# Patient Record
Sex: Male | Born: 1950 | Race: White | Hispanic: No | Marital: Married | Smoking: Current every day smoker
Health system: Southern US, Community
[De-identification: ages and names within clinical notes are randomized; demographics above are authoritative.]

## PROBLEM LIST (undated history)

## (undated) DIAGNOSIS — I10 Essential (primary) hypertension: Secondary | ICD-10-CM

## (undated) DIAGNOSIS — E785 Hyperlipidemia, unspecified: Secondary | ICD-10-CM

## (undated) DIAGNOSIS — E119 Type 2 diabetes mellitus without complications: Secondary | ICD-10-CM

## (undated) DIAGNOSIS — I63232 Cerebral infarction due to unspecified occlusion or stenosis of left carotid arteries: Secondary | ICD-10-CM

## (undated) DIAGNOSIS — F419 Anxiety disorder, unspecified: Secondary | ICD-10-CM

## (undated) HISTORY — DX: Type 2 diabetes mellitus without complications: E11.9

## (undated) HISTORY — DX: Essential (primary) hypertension: I10

## (undated) HISTORY — DX: Hyperlipidemia, unspecified: E78.5

## (undated) HISTORY — DX: Anxiety disorder, unspecified: F41.9

## (undated) HISTORY — DX: Cerebral infarction due to unspecified occlusion or stenosis of left carotid arteries: I63.232

---

## 2008-05-09 ENCOUNTER — Encounter: Admission: RE | Admit: 2008-05-09 | Discharge: 2008-05-09 | Payer: Self-pay | Admitting: Internal Medicine

## 2008-06-04 ENCOUNTER — Encounter: Admission: RE | Admit: 2008-06-04 | Discharge: 2008-06-04 | Payer: Self-pay | Admitting: Internal Medicine

## 2009-09-05 ENCOUNTER — Inpatient Hospital Stay (HOSPITAL_COMMUNITY): Admission: EM | Admit: 2009-09-05 | Discharge: 2009-09-11 | Payer: Self-pay | Admitting: Emergency Medicine

## 2009-09-05 ENCOUNTER — Ambulatory Visit: Payer: Self-pay | Admitting: Internal Medicine

## 2009-09-08 ENCOUNTER — Encounter (INDEPENDENT_AMBULATORY_CARE_PROVIDER_SITE_OTHER): Payer: Self-pay | Admitting: Neurology

## 2009-09-08 ENCOUNTER — Ambulatory Visit: Payer: Self-pay | Admitting: Physical Medicine & Rehabilitation

## 2009-09-08 ENCOUNTER — Ambulatory Visit: Payer: Self-pay | Admitting: Vascular Surgery

## 2009-09-10 ENCOUNTER — Encounter (INDEPENDENT_AMBULATORY_CARE_PROVIDER_SITE_OTHER): Payer: Self-pay | Admitting: Neurology

## 2009-09-11 ENCOUNTER — Inpatient Hospital Stay (HOSPITAL_COMMUNITY)
Admission: RE | Admit: 2009-09-11 | Discharge: 2009-09-23 | Payer: Self-pay | Admitting: Physical Medicine & Rehabilitation

## 2009-10-21 ENCOUNTER — Encounter
Admission: RE | Admit: 2009-10-21 | Discharge: 2009-10-28 | Payer: Self-pay | Admitting: Physical Medicine & Rehabilitation

## 2009-10-28 ENCOUNTER — Ambulatory Visit: Payer: Self-pay | Admitting: Physical Medicine & Rehabilitation

## 2010-01-19 ENCOUNTER — Encounter
Admission: RE | Admit: 2010-01-19 | Discharge: 2010-03-18 | Payer: Self-pay | Admitting: Physical Medicine & Rehabilitation

## 2010-01-27 ENCOUNTER — Ambulatory Visit: Payer: Self-pay | Admitting: Physical Medicine & Rehabilitation

## 2010-03-18 ENCOUNTER — Encounter
Admission: RE | Admit: 2010-03-18 | Discharge: 2010-03-18 | Payer: Self-pay | Source: Home / Self Care | Attending: Physical Medicine & Rehabilitation | Admitting: Physical Medicine & Rehabilitation

## 2010-05-13 IMAGING — XA IR ANGIO/CAROTID/CERV BI
1 series · 12 of 24 positions shown · IV contrast (IODINE)
Comparison: MRI-MRA brain of 09/06/2009

CLINICAL DATA: Left cerebral hemispheric stroke with right-sided
weakness and aphasia.  Abnormal MRA of the neck and the brain.

BILATERAL CAROTID ARTERIOGRAPHY AND BILATERAL VERTEBRAL ARTERY
ANGIOGRAMS

[Series 300: neuro · 12 of 139 slices shown]
[im 7/139]
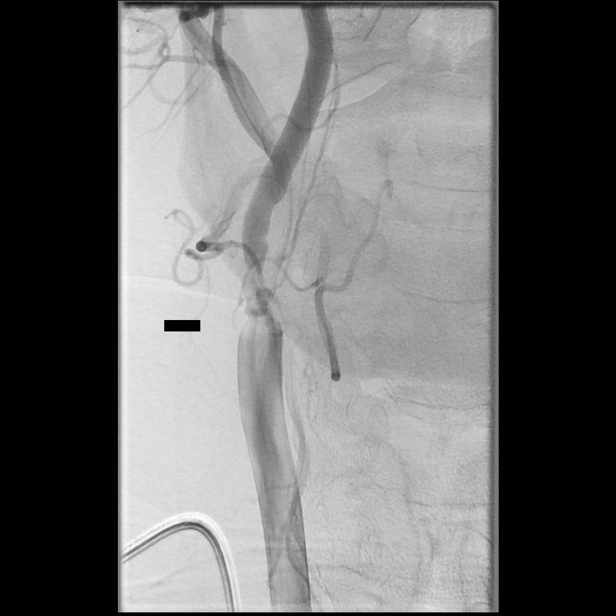
[im 19/139]
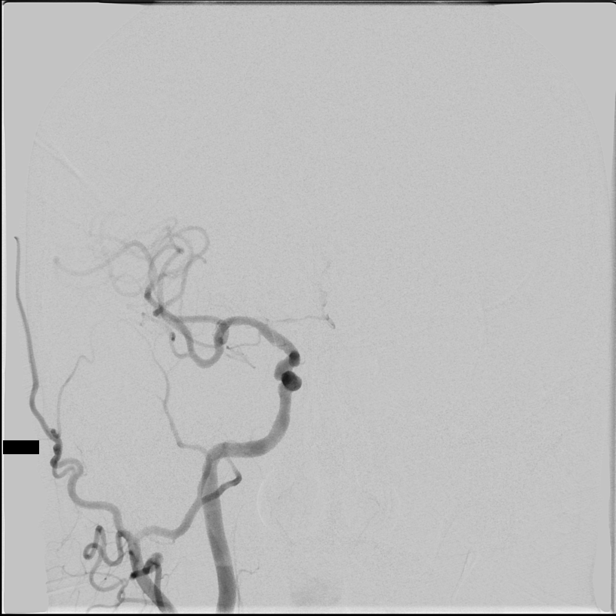
[im 31/139]
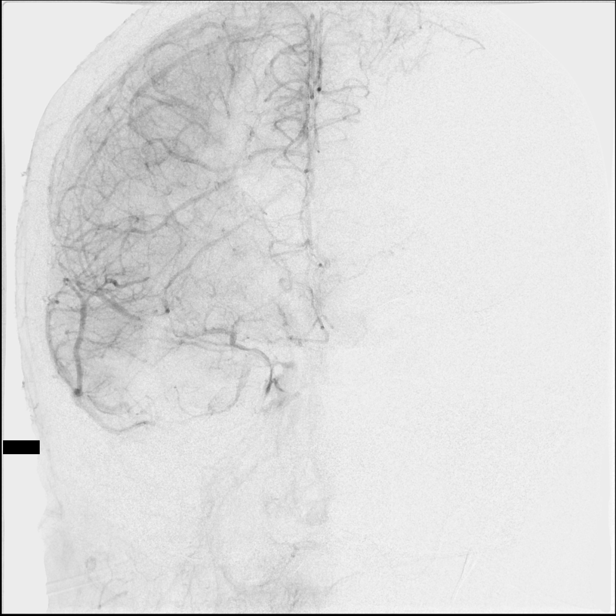
[im 43/139]
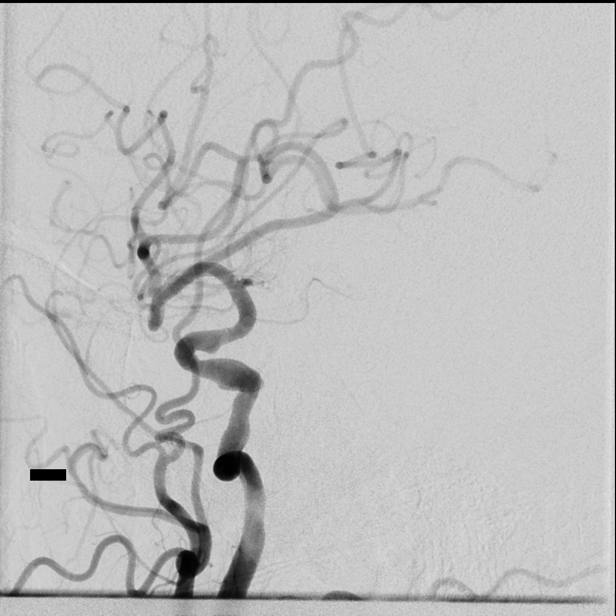
[im 55/139]
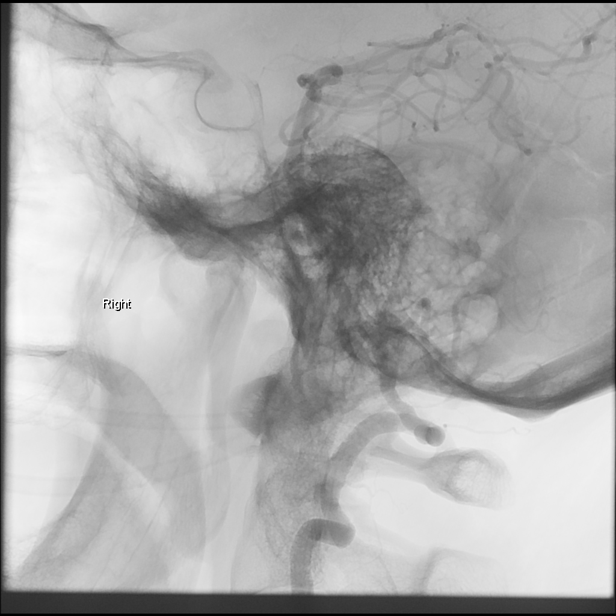
[im 67/139]
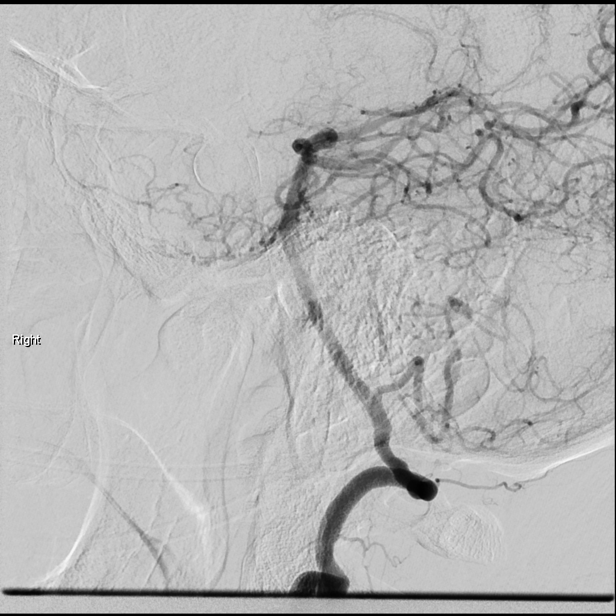
[im 79/139]
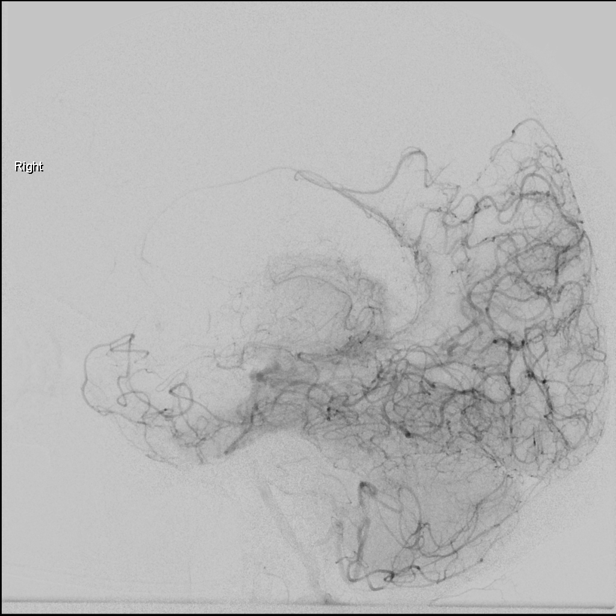
[im 91/139]
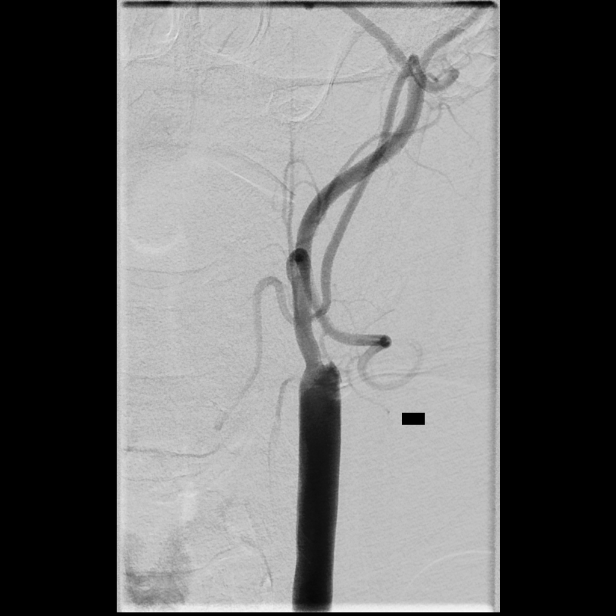
[im 103/139]
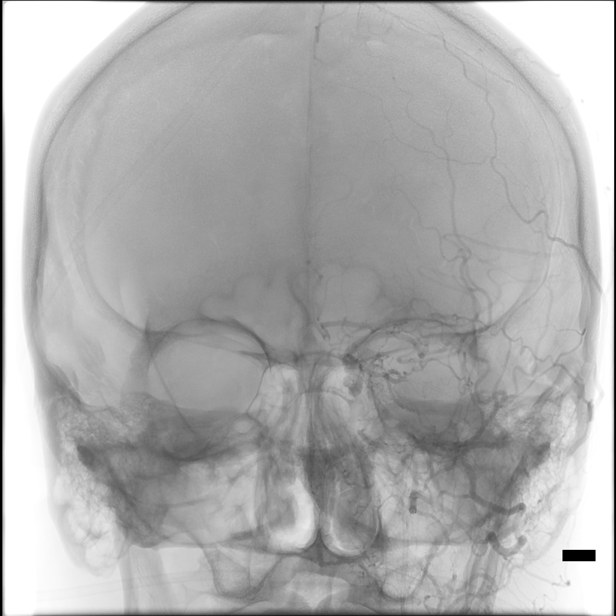
[im 115/139]
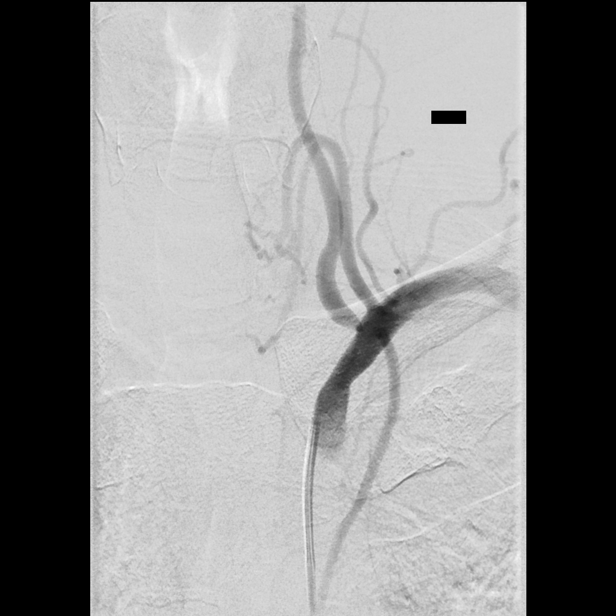
[im 127/139]
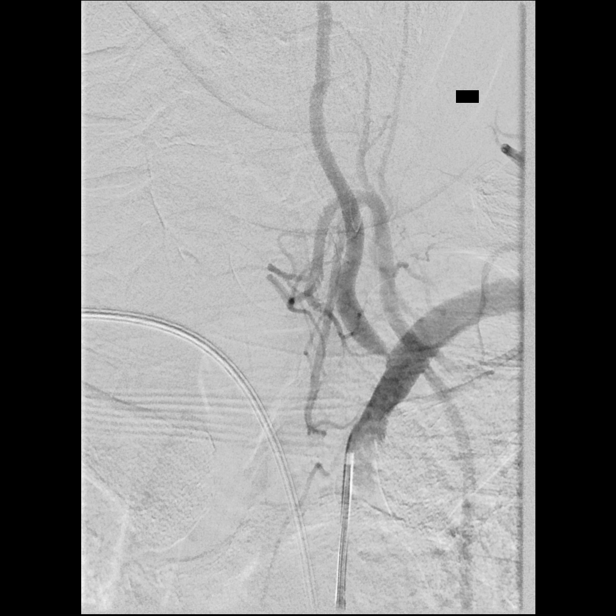
[im 139/139]
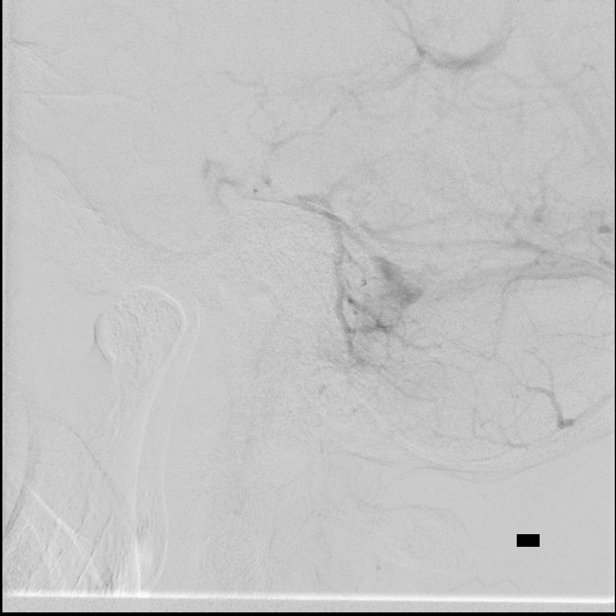

[12 of 24 positions shown; findings below may reference images not displayed]

Following a full explanation of the procedure along with the
potential associated complications, an informed witnessed consent
was obtained.

The right groin was prepped and draped in the usual sterile
fashion.  Thereafter using a modified Seldinger technique,
transfemoral access into the right common femoral artery was
obtained without difficulty.  Over a 0.035-inch guidewire, a 5-
French Pinnacle sheath was inserted.  Through this and also over a
0.035-inch guidewire, a 5-French JB1 catheter was advanced to the
aortic arch region and selectively positioned in the right common
carotid artery, the right vertebral artery, the left common carotid
artery and the left subclavian artery.

There were no acute complications.  The patient tolerated the
procedure well.

Medications utilized: Versed 1 mg IV.  Fentanyl 25 mg IV.

Contrast: Omnipaque 300 approximately 65 ml.
FINDINGS: The right common carotid arteriogram demonstrates the
right external carotid artery and its major branches to be normal.

The right internal carotid artery has an irregular plaque
associated with approximately 65-70% stenosis by the NASCET
criteria.  No intraluminal filling defects are seen.

The vessel is otherwise seen to opacify normally to the cranial
skull base.

The petrous segment is normal.

There is a focal approximately 40-50% narrowing of the right
internal carotid artery cavernous segment.

A mild broad-based protuberance is noted of the inferior aspect of
the distal cavernous segment of the right internal carotid artery.

The right middle cerebral artery is seen to opacify normally into
capillary and venous phases.  Moderate focal areas of
arteriosclerotic narrowing are seen involving the right anterior
cerebral artery A1 segment.  Opacification, however, is noted of
the right A2 segment.

The right vertebral artery origin is normal.  The vessel is seen to
opacify normally to the cranial skull base.

There is mild atherosclerotic irregularity involving the right
vertebrobasilar junction proximal to the right posterior inferior
cerebellar artery.

The right posterior inferior cerebral artery is seen to opacify
normally.

The basilar artery, the superior cerebellar arteries and the
anterior-inferior cerebellar arteries are seen to opacify normally
into capillary and venous phases.  There is a left anterior
inferior cerebral artery/posterior inferior cerebellar artery
complex.

There is mild narrowing of the left posterior cerebral artery
proximally.  Both posterior cerebral arteries are otherwise seen to
opacify normally into capillary and venous phases.  Leptomeningeal
collateralization of the parietal cortical branches of the left MCA
distribution is noted.

Also noted is retrograde opacification via the posterior temporal
branches of the anterior cerebral artery branches in the distal
corpus callosum.

The left common carotid arteriogram demonstrates the left external
carotid artery and its major branches to be normal.  The left
internal carotid artery has a stump at its origin.  There is no
string sign noted in the cervical segment of the left internal
carotid artery to the cranial skull base.

Distally, there is retrograde opacification of the distal cavernous
and supraclinoid segments from the ipsilateral ophthalmic artery
from the anterior ethmoidal branches.

Antegrade flow is noted in a diminutive left middle and left
anterior cerebral artery.  There is complete occlusion of the
superior division of the left middle cerebral artery.

The codominant left vertebral artery origin demonstrates a 90%
stenosis with mild post-stenotic dilatation.  The vessel is seen to
opacify normally to the cranial skull base.  Mild narrowing of the
left vertebrobasilar junction is noted just proximal to the
hypoplastic left posterior inferior cerebellar artery.

There is a left posterior-inferior cerebellar artery/anterior-
inferior cerebellar artery complex.  The opacified portion of the
basilar artery, the posterior cerebral arteries, the superior
cerebellar arteries and the anterior-inferior cerebellar arteries
is grossly normal into the delayed arterial phase.
IMPRESSION: 1.  65-70% stenosis of the right internal carotid artery at the
bulb by the NASCET criteria.
2.  90% stenosis of the origin of the left vertebral artery.

3.  40-50% stenosis of the right internal carotid artery in the
cavernous segment.
4.  Angiographically occluded left internal carotid artery at the
bulb without angiographic evidence of a string sign.  Partial
reconstitution of the left ICA in the cavernous segment from the
ipsilateral ophthalmic artery and the anterior ethmoidal arteries.
5.  Occluded superior division of the left middle cerebral artery.

## 2010-07-26 ENCOUNTER — Encounter: Payer: Self-pay | Admitting: Physical Medicine & Rehabilitation

## 2010-09-28 LAB — RAPID URINE DRUG SCREEN, HOSP PERFORMED
Amphetamines: NOT DETECTED
Barbiturates: NOT DETECTED
Cocaine: NOT DETECTED
Opiates: NOT DETECTED
Tetrahydrocannabinol: NOT DETECTED

## 2010-09-28 LAB — COMPREHENSIVE METABOLIC PANEL
Albumin: 3.2 g/dL — ABNORMAL LOW (ref 3.5–5.2)
Albumin: 3.2 g/dL — ABNORMAL LOW (ref 3.5–5.2)
Alkaline Phosphatase: 81 U/L (ref 39–117)
BUN: 7 mg/dL (ref 6–23)
BUN: 8 mg/dL (ref 6–23)
Calcium: 8.7 mg/dL (ref 8.4–10.5)
Chloride: 102 mEq/L (ref 96–112)
Creatinine, Ser: 0.66 mg/dL (ref 0.4–1.5)
GFR calc Af Amer: >60 mL/min (ref >60)
Potassium: 3.3 mEq/L — ABNORMAL LOW (ref 3.5–5.1)
Sodium: 137 mEq/L (ref 135–145)
Total Bilirubin: 0.6 mg/dL (ref 0.3–1.2)
Total Protein: 6.4 g/dL (ref 6.0–8.3)

## 2010-09-28 LAB — CBC
HCT: 37.2 % — ABNORMAL LOW (ref 39.0–52.0)
HCT: 38 % — ABNORMAL LOW (ref 39.0–52.0)
HCT: 39.9 % (ref 39.0–52.0)
HCT: 39.9 % (ref 39.0–52.0)
HCT: 45.8 % (ref 39.0–52.0)
Hemoglobin: 13 g/dL (ref 13.0–17.0)
Hemoglobin: 13.5 g/dL (ref 13.0–17.0)
Hemoglobin: 15.4 g/dL (ref 13.0–17.0)
MCHC: 33.8 g/dL (ref 30.0–36.0)
MCHC: 33.8 g/dL (ref 30.0–36.0)
MCHC: 34.8 g/dL (ref 30.0–36.0)
MCV: 93 fL (ref 78.0–100.0)
MCV: 93.4 fL (ref 78.0–100.0)
MCV: 93.5 fL (ref 78.0–100.0)
MCV: 93.7 fL (ref 78.0–100.0)
Platelets: 173 10*3/uL (ref 150–400)
Platelets: 228 10*3/uL (ref 150–400)
Platelets: 99 10*3/uL — ABNORMAL LOW (ref 150–400)
RBC: 4.19 MIL/uL — ABNORMAL LOW (ref 4.22–5.81)
RBC: 4.26 MIL/uL (ref 4.22–5.81)
RDW: 13.1 % (ref 11.5–15.5)
RDW: 13.2 % (ref 11.5–15.5)
RDW: 13.3 % (ref 11.5–15.5)
RDW: 13.4 % (ref 11.5–15.5)
RDW: 13.5 % (ref 11.5–15.5)
RDW: 13.5 % (ref 11.5–15.5)
WBC: 10.9 10*3/uL — ABNORMAL HIGH (ref 4.0–10.5)
WBC: 12 10*3/uL — ABNORMAL HIGH (ref 4.0–10.5)
WBC: 17.6 10*3/uL — ABNORMAL HIGH (ref 4.0–10.5)

## 2010-09-28 LAB — BASIC METABOLIC PANEL
BUN: 11 mg/dL (ref 6–23)
BUN: 16 mg/dL (ref 6–23)
CO2: 23 mEq/L (ref 19–32)
CO2: 27 mEq/L (ref 19–32)
CO2: 31 mEq/L (ref 19–32)
Calcium: 8.7 mg/dL (ref 8.4–10.5)
Calcium: 8.8 mg/dL (ref 8.4–10.5)
Calcium: 9.2 mg/dL (ref 8.4–10.5)
Calcium: 9.3 mg/dL (ref 8.4–10.5)
Chloride: 103 mEq/L (ref 96–112)
Chloride: 97 mEq/L (ref 96–112)
Creatinine, Ser: 0.75 mg/dL (ref 0.4–1.5)
Creatinine, Ser: 0.75 mg/dL (ref 0.4–1.5)
Creatinine, Ser: 0.86 mg/dL (ref 0.4–1.5)
GFR calc Af Amer: >60 mL/min (ref >60)
GFR calc Af Amer: >60 mL/min (ref >60)
Glucose, Bld: 116 mg/dL — ABNORMAL HIGH (ref 70–99)
Glucose, Bld: 120 mg/dL — ABNORMAL HIGH (ref 70–99)
Glucose, Bld: 144 mg/dL — ABNORMAL HIGH (ref 70–99)
Glucose, Bld: 97 mg/dL (ref 70–99)
Potassium: 3.4 mEq/L — ABNORMAL LOW (ref 3.5–5.1)
Potassium: 3.6 mEq/L (ref 3.5–5.1)
Sodium: 135 mEq/L (ref 135–145)
Sodium: 139 mEq/L (ref 135–145)
Sodium: 140 mEq/L (ref 135–145)
Sodium: 142 mEq/L (ref 135–145)

## 2010-09-28 LAB — URINE MICROSCOPIC-ADD ON

## 2010-09-28 LAB — DIFFERENTIAL
Basophils Absolute: 0.1 10*3/uL (ref 0.0–0.1)
Basophils Relative: 1 % (ref 0–1)
Eosinophils Absolute: 0.1 10*3/uL (ref 0.0–0.7)
Eosinophils Absolute: 0.1 10*3/uL (ref 0.0–0.7)
Eosinophils Relative: 1 % (ref 0–5)
Eosinophils Relative: 1 % (ref 0–5)
Lymphocytes Relative: 15 % (ref 12–46)
Lymphocytes Relative: 27 % (ref 12–46)
Lymphocytes Relative: 31 % (ref 12–46)
Lymphs Abs: 2.7 10*3/uL (ref 0.7–4.0)
Lymphs Abs: 3.7 10*3/uL (ref 0.7–4.0)
Monocytes Absolute: 1.1 10*3/uL — ABNORMAL HIGH (ref 0.1–1.0)
Monocytes Absolute: 1.6 10*3/uL — ABNORMAL HIGH (ref 0.1–1.0)
Monocytes Relative: 13 % — ABNORMAL HIGH (ref 3–12)
Neutro Abs: 13.5 10*3/uL — ABNORMAL HIGH (ref 1.7–7.7)
Neutro Abs: 6.8 10*3/uL (ref 1.7–7.7)

## 2010-09-28 LAB — GLUCOSE, CAPILLARY
Glucose-Capillary: 104 mg/dL — ABNORMAL HIGH (ref 70–99)
Glucose-Capillary: 111 mg/dL — ABNORMAL HIGH (ref 70–99)
Glucose-Capillary: 114 mg/dL — ABNORMAL HIGH (ref 70–99)
Glucose-Capillary: 116 mg/dL — ABNORMAL HIGH (ref 70–99)
Glucose-Capillary: 117 mg/dL — ABNORMAL HIGH (ref 70–99)
Glucose-Capillary: 117 mg/dL — ABNORMAL HIGH (ref 70–99)
Glucose-Capillary: 118 mg/dL — ABNORMAL HIGH (ref 70–99)
Glucose-Capillary: 122 mg/dL — ABNORMAL HIGH (ref 70–99)
Glucose-Capillary: 124 mg/dL — ABNORMAL HIGH (ref 70–99)
Glucose-Capillary: 127 mg/dL — ABNORMAL HIGH (ref 70–99)
Glucose-Capillary: 127 mg/dL — ABNORMAL HIGH (ref 70–99)
Glucose-Capillary: 130 mg/dL — ABNORMAL HIGH (ref 70–99)
Glucose-Capillary: 135 mg/dL — ABNORMAL HIGH (ref 70–99)
Glucose-Capillary: 141 mg/dL — ABNORMAL HIGH (ref 70–99)
Glucose-Capillary: 141 mg/dL — ABNORMAL HIGH (ref 70–99)
Glucose-Capillary: 143 mg/dL — ABNORMAL HIGH (ref 70–99)
Glucose-Capillary: 145 mg/dL — ABNORMAL HIGH (ref 70–99)
Glucose-Capillary: 158 mg/dL — ABNORMAL HIGH (ref 70–99)
Glucose-Capillary: 160 mg/dL — ABNORMAL HIGH (ref 70–99)
Glucose-Capillary: 167 mg/dL — ABNORMAL HIGH (ref 70–99)
Glucose-Capillary: 168 mg/dL — ABNORMAL HIGH (ref 70–99)
Glucose-Capillary: 174 mg/dL — ABNORMAL HIGH (ref 70–99)
Glucose-Capillary: 196 mg/dL — ABNORMAL HIGH (ref 70–99)
Glucose-Capillary: 95 mg/dL (ref 70–99)

## 2010-09-28 LAB — LUPUS ANTICOAGULANT PANEL: Lupus Anticoagulant: NOT DETECTED

## 2010-09-28 LAB — URINE CULTURE
Colony Count: >=100000
Special Requests: NEGATIVE

## 2010-09-28 LAB — PROTEIN C ACTIVITY: Protein C Activity: 172 % — ABNORMAL HIGH (ref 75–133)

## 2010-09-28 LAB — URINALYSIS, ROUTINE W REFLEX MICROSCOPIC
Bilirubin Urine: NEGATIVE
Glucose, UA: NEGATIVE mg/dL
Ketones, ur: NEGATIVE mg/dL
Protein, ur: NEGATIVE mg/dL
Protein, ur: NEGATIVE mg/dL
Specific Gravity, Urine: 1.027 (ref 1.005–1.030)
pH: 5.5 (ref 5.0–8.0)
pH: 6 (ref 5.0–8.0)

## 2010-09-28 LAB — HEMOGLOBIN A1C
Hgb A1c MFr Bld: 7.7 % — ABNORMAL HIGH (ref 4.6–6.1)
Mean Plasma Glucose: 174 mg/dL

## 2010-09-28 LAB — CARDIOLIPIN ANTIBODIES, IGG, IGM, IGA
Anticardiolipin IgA: <3 APL U/mL — ABNORMAL LOW (ref <10)
Anticardiolipin IgG: 3 GPL U/mL — ABNORMAL LOW (ref <10)
Anticardiolipin IgM: <3 MPL U/mL — ABNORMAL LOW (ref <10)

## 2010-09-28 LAB — LIPID PANEL
LDL Cholesterol: 123 mg/dL — ABNORMAL HIGH (ref 0–99)
Triglycerides: 124 mg/dL (ref <150)

## 2010-09-28 LAB — HEPATITIS PANEL, ACUTE
HCV Ab: NEGATIVE
Hepatitis B Surface Ag: NEGATIVE

## 2010-09-28 LAB — ANTITHROMBIN III: AntiThromb III Func: 111 % (ref 76–126)

## 2010-09-28 LAB — SEDIMENTATION RATE: Sed Rate: 52 mm/hr — ABNORMAL HIGH (ref 0–16)

## 2010-09-28 LAB — CARDIAC PANEL(CRET KIN+CKTOT+MB+TROPI)
CK, MB: 2.9 ng/mL (ref 0.3–4.0)
Relative Index: 1.4 (ref 0.0–2.5)
Relative Index: 1.5 (ref 0.0–2.5)
Total CK: 206 U/L (ref 7–232)

## 2010-09-28 LAB — PROTEIN S ACTIVITY: Protein S Activity: 95 % (ref 69–129)

## 2010-09-28 LAB — TROPONIN I: Troponin I: 0.04 ng/mL (ref 0.00–0.06)

## 2010-09-28 LAB — BETA-2-GLYCOPROTEIN I ABS, IGG/M/A: Beta-2 Glyco I IgG: <3 U/mL (ref <=15)

## 2010-09-28 LAB — VITAMIN B12: Vitamin B-12: 983 pg/mL — ABNORMAL HIGH (ref 211–911)

## 2010-09-28 LAB — HOMOCYSTEINE
Homocysteine: 6.6 umol/L (ref 4.0–15.4)
Homocysteine: 8.8 umol/L (ref 4.0–15.4)

## 2010-09-28 LAB — GC/CHLAMYDIA PROBE AMP, URINE: GC Probe Amp, Urine: NEGATIVE

## 2010-09-28 LAB — CK TOTAL AND CKMB (NOT AT ARMC)
CK, MB: 4.9 ng/mL — ABNORMAL HIGH (ref 0.3–4.0)
Relative Index: 0.4 (ref 0.0–2.5)

## 2010-09-28 LAB — PROTEIN S, TOTAL: Protein S Ag, Total: 129 % (ref 70–140)

## 2010-09-28 LAB — RPR
RPR Ser Ql: NONREACTIVE
RPR Ser Ql: NONREACTIVE

## 2010-09-28 LAB — FACTOR 5 LEIDEN

## 2010-09-28 LAB — HIV ANTIBODY (ROUTINE TESTING W REFLEX): HIV: NONREACTIVE

## 2010-09-28 LAB — POCT CARDIAC MARKERS: Myoglobin, poc: 139 ng/mL (ref 12–200)

## 2011-07-06 HISTORY — PX: CAROTID STENT INSERTION: SHX5766

## 2015-09-03 DIAGNOSIS — Z79899 Other long term (current) drug therapy: Secondary | ICD-10-CM | POA: Diagnosis not present

## 2015-09-03 DIAGNOSIS — E1159 Type 2 diabetes mellitus with other circulatory complications: Secondary | ICD-10-CM | POA: Diagnosis not present

## 2015-09-03 DIAGNOSIS — I10 Essential (primary) hypertension: Secondary | ICD-10-CM | POA: Diagnosis not present

## 2015-09-03 DIAGNOSIS — E784 Other hyperlipidemia: Secondary | ICD-10-CM | POA: Diagnosis not present

## 2015-09-08 DIAGNOSIS — I69351 Hemiplegia and hemiparesis following cerebral infarction affecting right dominant side: Secondary | ICD-10-CM | POA: Diagnosis not present

## 2015-09-08 DIAGNOSIS — E1165 Type 2 diabetes mellitus with hyperglycemia: Secondary | ICD-10-CM | POA: Diagnosis not present

## 2015-09-08 DIAGNOSIS — I69320 Aphasia following cerebral infarction: Secondary | ICD-10-CM | POA: Diagnosis not present

## 2015-09-08 DIAGNOSIS — E1159 Type 2 diabetes mellitus with other circulatory complications: Secondary | ICD-10-CM | POA: Diagnosis not present

## 2015-10-14 DIAGNOSIS — E875 Hyperkalemia: Secondary | ICD-10-CM | POA: Diagnosis not present

## 2015-10-20 DIAGNOSIS — I69351 Hemiplegia and hemiparesis following cerebral infarction affecting right dominant side: Secondary | ICD-10-CM | POA: Diagnosis not present

## 2015-10-20 DIAGNOSIS — Z0001 Encounter for general adult medical examination with abnormal findings: Secondary | ICD-10-CM | POA: Diagnosis not present

## 2015-10-20 DIAGNOSIS — E1165 Type 2 diabetes mellitus with hyperglycemia: Secondary | ICD-10-CM | POA: Diagnosis not present

## 2015-10-20 DIAGNOSIS — E1159 Type 2 diabetes mellitus with other circulatory complications: Secondary | ICD-10-CM | POA: Diagnosis not present

## 2015-10-20 DIAGNOSIS — I69320 Aphasia following cerebral infarction: Secondary | ICD-10-CM | POA: Diagnosis not present

## 2016-01-21 DIAGNOSIS — I6521 Occlusion and stenosis of right carotid artery: Secondary | ICD-10-CM | POA: Diagnosis not present

## 2016-01-27 DIAGNOSIS — E1165 Type 2 diabetes mellitus with hyperglycemia: Secondary | ICD-10-CM | POA: Diagnosis not present

## 2016-01-27 DIAGNOSIS — I10 Essential (primary) hypertension: Secondary | ICD-10-CM | POA: Diagnosis not present

## 2016-02-03 DIAGNOSIS — E875 Hyperkalemia: Secondary | ICD-10-CM | POA: Diagnosis not present

## 2016-02-03 DIAGNOSIS — M1990 Unspecified osteoarthritis, unspecified site: Secondary | ICD-10-CM | POA: Diagnosis not present

## 2016-02-03 DIAGNOSIS — E1169 Type 2 diabetes mellitus with other specified complication: Secondary | ICD-10-CM | POA: Diagnosis not present

## 2016-02-03 DIAGNOSIS — I69320 Aphasia following cerebral infarction: Secondary | ICD-10-CM | POA: Diagnosis not present

## 2016-02-03 DIAGNOSIS — E1159 Type 2 diabetes mellitus with other circulatory complications: Secondary | ICD-10-CM | POA: Diagnosis not present

## 2016-02-03 DIAGNOSIS — B351 Tinea unguium: Secondary | ICD-10-CM | POA: Diagnosis not present

## 2016-02-03 DIAGNOSIS — I69351 Hemiplegia and hemiparesis following cerebral infarction affecting right dominant side: Secondary | ICD-10-CM | POA: Diagnosis not present

## 2016-02-03 DIAGNOSIS — F329 Major depressive disorder, single episode, unspecified: Secondary | ICD-10-CM | POA: Diagnosis not present

## 2016-02-03 DIAGNOSIS — I10 Essential (primary) hypertension: Secondary | ICD-10-CM | POA: Diagnosis not present

## 2016-02-03 DIAGNOSIS — E785 Hyperlipidemia, unspecified: Secondary | ICD-10-CM | POA: Diagnosis not present

## 2016-02-03 DIAGNOSIS — E1165 Type 2 diabetes mellitus with hyperglycemia: Secondary | ICD-10-CM | POA: Diagnosis not present

## 2016-06-22 DIAGNOSIS — E1165 Type 2 diabetes mellitus with hyperglycemia: Secondary | ICD-10-CM | POA: Diagnosis not present

## 2016-06-22 DIAGNOSIS — I10 Essential (primary) hypertension: Secondary | ICD-10-CM | POA: Diagnosis not present

## 2016-07-15 DIAGNOSIS — I69320 Aphasia following cerebral infarction: Secondary | ICD-10-CM | POA: Diagnosis not present

## 2016-07-15 DIAGNOSIS — E785 Hyperlipidemia, unspecified: Secondary | ICD-10-CM | POA: Diagnosis not present

## 2016-07-15 DIAGNOSIS — I69351 Hemiplegia and hemiparesis following cerebral infarction affecting right dominant side: Secondary | ICD-10-CM | POA: Diagnosis not present

## 2016-07-15 DIAGNOSIS — F329 Major depressive disorder, single episode, unspecified: Secondary | ICD-10-CM | POA: Diagnosis not present

## 2016-07-15 DIAGNOSIS — E1159 Type 2 diabetes mellitus with other circulatory complications: Secondary | ICD-10-CM | POA: Diagnosis not present

## 2016-07-15 DIAGNOSIS — B351 Tinea unguium: Secondary | ICD-10-CM | POA: Diagnosis not present

## 2016-07-15 DIAGNOSIS — E1169 Type 2 diabetes mellitus with other specified complication: Secondary | ICD-10-CM | POA: Diagnosis not present

## 2016-07-15 DIAGNOSIS — I10 Essential (primary) hypertension: Secondary | ICD-10-CM | POA: Diagnosis not present

## 2016-07-15 DIAGNOSIS — E875 Hyperkalemia: Secondary | ICD-10-CM | POA: Diagnosis not present

## 2016-07-15 DIAGNOSIS — E1165 Type 2 diabetes mellitus with hyperglycemia: Secondary | ICD-10-CM | POA: Diagnosis not present

## 2016-07-15 DIAGNOSIS — M1990 Unspecified osteoarthritis, unspecified site: Secondary | ICD-10-CM | POA: Diagnosis not present

## 2017-01-14 DIAGNOSIS — I63232 Cerebral infarction due to unspecified occlusion or stenosis of left carotid arteries: Secondary | ICD-10-CM | POA: Diagnosis not present

## 2017-01-14 DIAGNOSIS — E1165 Type 2 diabetes mellitus with hyperglycemia: Secondary | ICD-10-CM | POA: Diagnosis not present

## 2017-01-14 DIAGNOSIS — I10 Essential (primary) hypertension: Secondary | ICD-10-CM | POA: Diagnosis not present

## 2017-01-14 DIAGNOSIS — Z6824 Body mass index (BMI) 24.0-24.9, adult: Secondary | ICD-10-CM | POA: Diagnosis not present

## 2017-01-14 DIAGNOSIS — F419 Anxiety disorder, unspecified: Secondary | ICD-10-CM | POA: Diagnosis not present

## 2017-01-14 DIAGNOSIS — E782 Mixed hyperlipidemia: Secondary | ICD-10-CM | POA: Diagnosis not present

## 2017-01-19 ENCOUNTER — Other Ambulatory Visit: Payer: Self-pay

## 2017-01-19 DIAGNOSIS — I63239 Cerebral infarction due to unspecified occlusion or stenosis of unspecified carotid arteries: Secondary | ICD-10-CM

## 2017-01-31 ENCOUNTER — Encounter: Payer: Self-pay | Admitting: Vascular Surgery

## 2017-03-14 ENCOUNTER — Encounter: Payer: Self-pay | Admitting: Surgery

## 2017-03-14 ENCOUNTER — Encounter (HOSPITAL_COMMUNITY): Payer: Self-pay

## 2017-03-18 ENCOUNTER — Encounter: Payer: Medicare Other | Admitting: Vascular Surgery

## 2017-03-18 ENCOUNTER — Encounter (HOSPITAL_COMMUNITY): Payer: Self-pay

## 2017-04-01 DIAGNOSIS — F419 Anxiety disorder, unspecified: Secondary | ICD-10-CM | POA: Diagnosis not present

## 2017-04-01 DIAGNOSIS — Z6824 Body mass index (BMI) 24.0-24.9, adult: Secondary | ICD-10-CM | POA: Diagnosis not present

## 2017-04-01 DIAGNOSIS — I63232 Cerebral infarction due to unspecified occlusion or stenosis of left carotid arteries: Secondary | ICD-10-CM | POA: Diagnosis not present

## 2017-04-01 DIAGNOSIS — I10 Essential (primary) hypertension: Secondary | ICD-10-CM | POA: Diagnosis not present

## 2017-04-01 DIAGNOSIS — E782 Mixed hyperlipidemia: Secondary | ICD-10-CM | POA: Diagnosis not present

## 2017-04-01 DIAGNOSIS — E1165 Type 2 diabetes mellitus with hyperglycemia: Secondary | ICD-10-CM | POA: Diagnosis not present

## 2017-04-22 ENCOUNTER — Ambulatory Visit (INDEPENDENT_AMBULATORY_CARE_PROVIDER_SITE_OTHER): Payer: Medicare Other | Admitting: Vascular Surgery

## 2017-04-22 ENCOUNTER — Ambulatory Visit (HOSPITAL_COMMUNITY)
Admission: RE | Admit: 2017-04-22 | Discharge: 2017-04-22 | Disposition: A | Discharge status: Home / Self Care | Payer: Medicare Other | Source: Ambulatory Visit | Attending: Vascular Surgery | Admitting: Vascular Surgery

## 2017-04-22 ENCOUNTER — Encounter: Payer: Self-pay | Admitting: Vascular Surgery

## 2017-04-22 VITALS — BP 123/76 | HR 68 | Temp 97.8°F | Resp 18 | Ht 68.0 in | Wt 160.1 lb

## 2017-04-22 DIAGNOSIS — I63232 Cerebral infarction due to unspecified occlusion or stenosis of left carotid arteries: Secondary | ICD-10-CM | POA: Diagnosis not present

## 2017-04-22 DIAGNOSIS — I63239 Cerebral infarction due to unspecified occlusion or stenosis of unspecified carotid arteries: Secondary | ICD-10-CM | POA: Insufficient documentation

## 2017-04-22 LAB — VAS US CAROTID
LCCAPDIAS: 20 cm/s
LEFT ECA DIAS: -50 cm/s
Left CCA prox sys: 98 cm/s
RIGHT CCA MID DIAS: 24 cm/s
RIGHT ECA DIAS: -43 cm/s
Right CCA prox dias: 21 cm/s
Right CCA prox sys: 77 cm/s
Right cca dist sys: -92 cm/s

## 2017-04-22 NOTE — Progress Notes (Signed)
Patient ID: Darren Chung, male   DOB: 03-Feb-1951, 66 y.o.   MRN: [id number]  Reason for Consult: new evaluation (hx of CVA with carotid stent) and Carotid   Referred by Richardean Chimera, MD  Subjective:     HPI:  Darren Chung is a 66 y.o. male with a history of diabetes hypertension high cholesterol and current every day smoker. He has a history of a left-sided stroke with occlusion of his left ICA. He also has had intervention on his right internal carotid artery he states was a stent in 2013. He also has a scar on the right neck suggestive of a right carotid endarterectomy in the past. Stent was placed 2013. He does not have symptoms from the stent. He is maintained on Plavix and statin. He does have bruising from a statin. He does not have any heart disease. He has no limitations to his walking. 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  Past Medical History:  Diagnosis Date  . Anxiety disorder   . Cerebral infarction due to unspecified occlusion or stenosis of left carotid arteries (HCC)   . Diabetes mellitus without complication (HCC)   . Hyperlipidemia   . Hypertension    Family History  Problem Relation Age of Onset  . Stroke Maternal Grandmother    Past Surgical History:  Procedure Laterality Date  . CAROTID STENT INSERTION Right 2013    Short Social History:  Social History  Substance Use Topics  . Smoking status: Current Every Day Smoker    Types: Cigarettes  . Smokeless tobacco: Current User     Comment: 35-40 years  . Alcohol use No    No Known Allergies  Current Outpatient Prescriptions  Medication Sig Dispense Refill  . amLODipine (NORVASC) 10 MG tablet Take 10 mg by mouth daily.    Marland Kitchen buPROPion (WELLBUTRIN XL) 150 MG 24 hr tablet Take 150 mg by mouth daily.    . clopidogrel (PLAVIX) 75 MG tablet Take 75 mg by mouth daily.    Marland Kitchen lisinopril (PRINIVIL,ZESTRIL) 5 MG tablet Take 5 mg by mouth daily.    .  metFORMIN (GLUCOPHAGE) 500 MG tablet Take 500 mg by mouth 2 (two) times daily with a meal.    . metoprolol tartrate (LOPRESSOR) 25 MG tablet Take 25 mg by mouth 2 (two) times daily.    . simvastatin (ZOCOR) 20 MG tablet Take 20 mg by mouth daily.     No current facility-administered medications for this visit.     Review of Systems  Constitutional:  Constitutional negative. HENT: HENT negative.  Eyes: Eyes negative.  Respiratory: Respiratory negative.  Cardiovascular: Positive for leg swelling.  GI: Gastrointestinal negative.  Musculoskeletal: Musculoskeletal negative.  Skin: Skin negative.  Neurological: Positive for focal weakness.  Hematologic: Positive for bruises/bleeds easily.        Objective:  Objective   Vitals:   04/22/17 1502 04/22/17 1503 04/22/17 1504  BP: 128/69 (!) 151/88 123/76  Pulse: 68    Resp: 18    Temp: 97.8 F (36.6 C)    TempSrc: Oral    SpO2: 97%    Weight: 160 lb 1.6 oz (72.6 kg)    Height: 5\' 8"  (1.727 m)     Body mass index is 24.34 kg/m.  Physical Exam  Constitutional: He is oriented to person, place, and time. He appears well-developed.  HENT:  Head: Normocephalic.  Eyes: Pupils are equal, round, and reactive to light.  Neck: Normal range of motion.  Well healed right neck incision  Cardiovascular: Normal rate.   Pulses:      Radial pulses are 2+ on the right side, and 2+ on the left side.       Femoral pulses are 2+ on the right side, and 2+ on the left side. Pulmonary/Chest: Effort normal.  Abdominal: Soft. He exhibits no mass.  Musculoskeletal: Normal range of motion. He exhibits no edema.  Neurological: He is alert and oriented to person, place, and time.  Skin: Skin is warm and dry.  Psychiatric: He has a normal mood and affect. His behavior is normal. Thought content normal.    Data: I have independently interpreted his carotid duplex which demonstrated an occluded left ICA with a stent in his right ICA velocities  demonstrate no significant restenosis.     Assessment/Plan:     66 year old male with a history of a left ICA occlusion and stroke and had presumably previous carotid endarterectomy and subsequent stenting but he is asymptomatic from that side. 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 He will continue Plavix at this time although I have offered that he could potentially switched to aspirin he is uncomfortable with this. We'll get him f/u with repeat duplex in 1 year. We discussed the signs and symptoms of stroke and he demonstrates good understanding.     Maeola HarmanBrandon Christopher Rayna Brenner MD [facility]

## 2017-05-17 NOTE — Addendum Note (Signed)
Addended by: Sherina Stammer A on: 05/17/2017 03:37 PM   Modules accepted: Orders  

## 2017-10-24 DIAGNOSIS — E782 Mixed hyperlipidemia: Secondary | ICD-10-CM | POA: Diagnosis not present

## 2017-10-24 DIAGNOSIS — Z1331 Encounter for screening for depression: Secondary | ICD-10-CM | POA: Diagnosis not present

## 2017-10-24 DIAGNOSIS — E1165 Type 2 diabetes mellitus with hyperglycemia: Secondary | ICD-10-CM | POA: Diagnosis not present

## 2017-10-24 DIAGNOSIS — Z1389 Encounter for screening for other disorder: Secondary | ICD-10-CM | POA: Diagnosis not present

## 2017-10-24 DIAGNOSIS — I10 Essential (primary) hypertension: Secondary | ICD-10-CM | POA: Diagnosis not present

## 2017-10-24 DIAGNOSIS — I63232 Cerebral infarction due to unspecified occlusion or stenosis of left carotid arteries: Secondary | ICD-10-CM | POA: Diagnosis not present

## 2017-10-24 DIAGNOSIS — Z6824 Body mass index (BMI) 24.0-24.9, adult: Secondary | ICD-10-CM | POA: Diagnosis not present

## 2017-10-24 DIAGNOSIS — F419 Anxiety disorder, unspecified: Secondary | ICD-10-CM | POA: Diagnosis not present

## 2018-04-21 DIAGNOSIS — F419 Anxiety disorder, unspecified: Secondary | ICD-10-CM | POA: Diagnosis not present

## 2018-04-21 DIAGNOSIS — I10 Essential (primary) hypertension: Secondary | ICD-10-CM | POA: Diagnosis not present

## 2018-04-21 DIAGNOSIS — E1165 Type 2 diabetes mellitus with hyperglycemia: Secondary | ICD-10-CM | POA: Diagnosis not present

## 2018-04-21 DIAGNOSIS — E782 Mixed hyperlipidemia: Secondary | ICD-10-CM | POA: Diagnosis not present

## 2018-04-24 DIAGNOSIS — I10 Essential (primary) hypertension: Secondary | ICD-10-CM | POA: Diagnosis not present

## 2018-04-24 DIAGNOSIS — Z0000 Encounter for general adult medical examination without abnormal findings: Secondary | ICD-10-CM | POA: Diagnosis not present

## 2018-04-24 DIAGNOSIS — Z6825 Body mass index (BMI) 25.0-25.9, adult: Secondary | ICD-10-CM | POA: Diagnosis not present

## 2018-09-01 ENCOUNTER — Other Ambulatory Visit: Payer: Self-pay

## 2018-09-01 DIAGNOSIS — I63232 Cerebral infarction due to unspecified occlusion or stenosis of left carotid arteries: Secondary | ICD-10-CM

## 2018-09-01 DIAGNOSIS — I63239 Cerebral infarction due to unspecified occlusion or stenosis of unspecified carotid arteries: Secondary | ICD-10-CM

## 2018-09-08 ENCOUNTER — Encounter (HOSPITAL_COMMUNITY): Payer: Medicare Other

## 2018-09-08 ENCOUNTER — Ambulatory Visit: Payer: Medicare Other | Admitting: Family

## 2018-10-06 ENCOUNTER — Ambulatory Visit: Payer: Medicare Other | Admitting: Family

## 2018-10-06 ENCOUNTER — Encounter (HOSPITAL_COMMUNITY): Payer: Medicare Other

## 2018-10-23 DIAGNOSIS — F419 Anxiety disorder, unspecified: Secondary | ICD-10-CM | POA: Diagnosis not present

## 2018-10-23 DIAGNOSIS — E1165 Type 2 diabetes mellitus with hyperglycemia: Secondary | ICD-10-CM | POA: Diagnosis not present

## 2018-10-23 DIAGNOSIS — E782 Mixed hyperlipidemia: Secondary | ICD-10-CM | POA: Diagnosis not present

## 2018-10-23 DIAGNOSIS — I10 Essential (primary) hypertension: Secondary | ICD-10-CM | POA: Diagnosis not present

## 2018-10-26 DIAGNOSIS — E782 Mixed hyperlipidemia: Secondary | ICD-10-CM | POA: Diagnosis not present

## 2018-10-26 DIAGNOSIS — E1165 Type 2 diabetes mellitus with hyperglycemia: Secondary | ICD-10-CM | POA: Diagnosis not present

## 2018-10-26 DIAGNOSIS — I10 Essential (primary) hypertension: Secondary | ICD-10-CM | POA: Diagnosis not present

## 2018-10-26 DIAGNOSIS — F419 Anxiety disorder, unspecified: Secondary | ICD-10-CM | POA: Diagnosis not present

## 2018-10-26 DIAGNOSIS — I63232 Cerebral infarction due to unspecified occlusion or stenosis of left carotid arteries: Secondary | ICD-10-CM | POA: Diagnosis not present

## 2018-10-26 DIAGNOSIS — Z6824 Body mass index (BMI) 24.0-24.9, adult: Secondary | ICD-10-CM | POA: Diagnosis not present

## 2018-10-30 ENCOUNTER — Encounter (INDEPENDENT_AMBULATORY_CARE_PROVIDER_SITE_OTHER): Payer: Self-pay

## 2019-01-15 ENCOUNTER — Other Ambulatory Visit: Payer: Self-pay

## 2019-01-15 ENCOUNTER — Ambulatory Visit (INDEPENDENT_AMBULATORY_CARE_PROVIDER_SITE_OTHER): Payer: Medicare Other | Admitting: Family

## 2019-01-15 ENCOUNTER — Ambulatory Visit (HOSPITAL_COMMUNITY)
Admission: RE | Admit: 2019-01-15 | Discharge: 2019-01-15 | Disposition: A | Discharge status: Home / Self Care | Payer: Medicare Other | Source: Ambulatory Visit | Attending: Vascular Surgery | Admitting: Vascular Surgery

## 2019-01-15 ENCOUNTER — Encounter: Payer: Self-pay | Admitting: Family

## 2019-01-15 VITALS — BP 125/77 | HR 66 | Temp 97.6°F | Resp 12 | Ht 68.0 in | Wt 157.3 lb

## 2019-01-15 DIAGNOSIS — Z9889 Other specified postprocedural states: Secondary | ICD-10-CM | POA: Diagnosis not present

## 2019-01-15 DIAGNOSIS — I63239 Cerebral infarction due to unspecified occlusion or stenosis of unspecified carotid arteries: Secondary | ICD-10-CM

## 2019-01-15 DIAGNOSIS — I63232 Cerebral infarction due to unspecified occlusion or stenosis of left carotid arteries: Secondary | ICD-10-CM | POA: Insufficient documentation

## 2019-01-15 DIAGNOSIS — F17200 Nicotine dependence, unspecified, uncomplicated: Secondary | ICD-10-CM | POA: Diagnosis not present

## 2019-01-15 NOTE — Progress Notes (Signed)
Chief Complaint: Follow up Extracranial Carotid Artery Stenosis   History of Present Illness  Darren Chung is a 68 y.o. male whom Dr. Randie Heinzain evaluated on 04-22-17 for extracranial carotid artery stenosis.  He has a history of diabetes, hypertension, dyslipidemia, and current every day smoker.  He has a history of a left brain stroke with occlusion of his left ICA.  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  He also has had intervention on his right internal carotid artery, he states was a stent in 2013.  He also has a scar on the right neck suggestive of a right carotid endarterectomy in the past. Stent was placed 2013.  He does not have symptoms from the stent.  He is maintained on Plavix and statin.  He does have bruising from a statin.  He does not have any heart disease.  He has no limitations to his walking.     Dr. Randie Heinzain last evaluated pt on 04-22-17. At that time pt had a history of a left ICA occlusion and stroke and had presumably previous carotid endarterectomy and subsequent stenting but he was asymptomatic from that side. 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 He was to continue Plavix at that time although Dr. Randie Heinzain offered that he could potentially switch to aspirin he was uncomfortable with that. He was to return with repeat duplex in 1 year.  Dtr (on the phone) states that his right foot is dropped since the stroke, but he states he has no trouble walking.  Pt denies pain or weakness in his legs with walking, denies non healing wounds in his feet or legs.   Diabetic: yes, daughter states last A1C was 5.1, normal Tobacco use: smoker  (8 cigs/day, dtr states he quit for 3-4 years after his stroke then resumed)  Pt meds include: Statin : yes  Beta blocker: yes ASA: no Other anticoagulants/antiplatelets: Plavix   Past Medical History:  Diagnosis Date  . Anxiety disorder   .  Cerebral infarction due to unspecified occlusion or stenosis of left carotid arteries (HCC)   . Diabetes mellitus without complication (HCC)   . Hyperlipidemia   . Hypertension     Social History Social History   Tobacco Use  . Smoking status: Current Every Day Smoker    Types: Cigarettes  . Smokeless tobacco: Current User  . Tobacco comment: 35-40 years  Substance Use Topics  . Alcohol use: No  . Drug use: Not on file    Family History Family History  Problem Relation Age of Onset  . Stroke Maternal Grandmother     Surgical History Past Surgical History:  Procedure Laterality Date  . CAROTID STENT INSERTION Right 2013    No Known Allergies  Current Outpatient Medications  Medication Sig Dispense Refill  . amLODipine (NORVASC) 10 MG tablet Take 10 mg by mouth daily.    Marland Kitchen. buPROPion (WELLBUTRIN XL) 150 MG 24 hr tablet Take 150 mg by mouth daily.    . clopidogrel (PLAVIX) 75 MG tablet Take 75 mg by mouth daily.    Marland Kitchen. lisinopril (PRINIVIL,ZESTRIL) 5 MG tablet Take 5 mg by mouth daily.    . metFORMIN (GLUCOPHAGE) 500 MG tablet Take 500 mg by mouth 2 (two) times daily with a meal.    . metoprolol tartrate (LOPRESSOR) 25 MG tablet Take 25 mg by mouth 2 (two) times daily.    . simvastatin (ZOCOR) 20 MG tablet  Take 20 mg by mouth daily.     No current facility-administered medications for this visit.     Review of Systems : See HPI for pertinent positives and negatives.  Physical Examination  Vitals:   01/15/19 1240  BP: 125/77  Pulse: 66  Resp: 12  Temp: 97.6 F (36.4 C)  TempSrc: Temporal  SpO2: 98%  Weight: 157 lb 4.8 oz (71.4 kg)  Height: 5\' 8"  (1.727 m)   Body mass index is 23.92 kg/m.  General: WDWN pleasant male in NAD GAIT: normal Eyes: PERRLA HENT: No gross abnormalities.  Pulmonary:  Respirations are non-labored, fair air movement in all fields, no rales,  rhonchi, or wheezing. Cardiac: regular rhythm, no detected murmur.  VASCULAR EXAM Carotid  Bruits Right Left   Negative Negative     Abdominal aortic pulse is not palpable. Radial pulses are 2+ palpable and equal.                                                                                                                            LE Pulses Right Left       POPLITEAL  not palpable   not palpable       POSTERIOR TIBIAL  not palpable   not palpable        DORSALIS PEDIS      ANTERIOR TIBIAL not palpable  not palpable    Gastrointestinal: soft, nontender, BS WNL, no r/g, no palpable masses. Musculoskeletal: + muscle atrophy/wasting in right upper extremity. M/S 5/5 throughout except 0/5 in right UE, extremities without ischemic changes. Skin: No rashes, no ulcers, no cellulitis.   Neurologic:  A&O X 3; appropriate affect, sensation is normal; speech is slightly aphasic, CN 2-12 intact, pain and light touch intact in extremities, motor exam as listed above. Psychiatric: Normal thought content, mood appropriate to clinical situation.    Assessment: Darren FudgeForest Matthies is a 68 y.o. male who has a history of a left ICA occlusion and left brain stroke and had presumably previous carotid endarterectomy and subsequent stenting in 2013 but he is asymptomatic from that side.  His right arm is flaccid since the stroke, he has mild right foot drop. Daughter Debbe Chung (on the phone), states pt has had no subsequent strokes or TIA's.   Today's carotid duplex shows a patent right internal carotid artery stent with no evidence for restenosis. Left Carotid: Known total occlusion of the left ICA. No significant change compared to the exam in October 2018.  His atherosclerotic risk factors include active smoking, history of DM with current normal A1C,, and history of stroke. He takes a daily statin and Plavix. He has no bleeding issues. Continue Plavix and a statin.    DATA Carotid Duplex (01-15-19): Right Carotid Findings: +----------+--------+--------+--------+--------+--------+            PSV cm/sEDV cm/sStenosisDescribeComments +----------+--------+--------+--------+--------+--------+ CCA Prox  86      16                               +----------+--------+--------+--------+--------+--------+  CCA Mid   92      22                               +----------+--------+--------+--------+--------+--------+ CCA Distal80      19                      stent    +----------+--------+--------+--------+--------+--------+ ICA Prox                                  stent    +----------+--------+--------+--------+--------+--------+ ICA Mid   69      19                               +----------+--------+--------+--------+--------+--------+ ICA Distal55      22                               +----------+--------+--------+--------+--------+--------+ ECA       264     26                               +----------+--------+--------+--------+--------+--------+  +----------+--------+-------+----------------+-------------------+           PSV cm/sEDV cmsDescribe        Arm Pressure (mmHG) +----------+--------+-------+----------------+-------------------+ [id number]     3      Multiphasic, WNL                    +----------+--------+-------+----------------+-------------------+  +---------+--------+--+--------+--+---------+ VertebralPSV cm/s57EDV cm/s16Antegrade +---------+--------+--+--------+--+---------+    Right Stent(s): +---------------+--+--++++ Proximal Stent 7316 +---------------+--+--++++ Mid Stent      6820 +---------------+--+--++++ Distal Stent   7924 +---------------+--+--++++ Distal to [id number] +---------------+--+--++++   Left Carotid Findings: +----------+--------+--------+--------+------------+--------+           PSV cm/sEDV cm/sStenosisDescribe    Comments +----------+--------+--------+--------+------------+--------+ CCA Prox  111     12                                    +----------+--------+--------+--------+------------+--------+ CCA Mid   50      11              heterogenous         +----------+--------+--------+--------+------------+--------+ CCA Distal69      15              heterogenous         +----------+--------+--------+--------+------------+--------+ ICA Prox                  Occluded                     +----------+--------+--------+--------+------------+--------+ ICA Mid                   Occluded                     +----------+--------+--------+--------+------------+--------+ ICA Distal                Occluded                     +----------+--------+--------+--------+------------+--------+ ECA       201  27                                   +----------+--------+--------+--------+------------+--------+  +----------+--------+--------+----------------+-------------------+ SubclavianPSV cm/sEDV cm/sDescribe        Arm Pressure (mmHG) +----------+--------+--------+----------------+-------------------+           179     3       Multiphasic, WNL                    +----------+--------+--------+----------------+-------------------+  +---------+--------+--+--------+--+---------+ VertebralPSV cm/s54EDV cm/s18Antegrade +---------+--------+--+--------+--+---------+ Summary: Right Carotid: The ECA appears >50% stenosed, limited visualization. Patent                right internal carotid artery stent with no evidence for                restenosis. Left Carotid: Known total occlusion of the left ICA. No significant change compared to the exam in October 2018.   Plan: Follow-up in 1 year with Carotid Duplex scan.  Over 3 minutes was spent counseling patient re smoking cessation, and patient was given some free resources re smoking cessation.     I discussed in depth with the patient the nature of atherosclerosis, and emphasized the importance of maximal  medical management including strict control of blood pressure, blood glucose, and lipid levels, obtaining regular exercise, and cessation of smoking.  The patient is aware that without maximal medical management the underlying atherosclerotic disease process will progress, limiting the benefit of any interventions. The patient was given information about stroke prevention and what symptoms should prompt the patient to seek immediate medical care. Thank you for allowing us to participate in this patient's care.  Charisse MarchSuzanne Nickel, RN, MSN, FNP-C [facility] Office: [phone]  Clinic Physician: Myra GianottiBrabham  01/15/19 12:44 PM

## 2019-01-15 NOTE — Patient Instructions (Signed)
Steps to Quit Smoking Smoking tobacco is the leading cause of preventable death. It can affect almost every organ in the body. Smoking puts you and people around you at risk for many serious, long-lasting (chronic) diseases. Quitting smoking can be hard, but it is one of the best things that you can do for your health. It is never too late to quit. How do I get ready to quit? When you decide to quit smoking, make a plan to help you succeed. Before you quit:  Pick a date to quit. Set a date within the next 2 weeks to give you time to prepare.  Write down the reasons why you are quitting. Keep this list in places where you will see it often.  Tell your family, friends, and co-workers that you are quitting. Their support is important.  Talk with your doctor about the choices that may help you quit.  Find out if your health insurance will pay for these treatments.  Know the people, places, things, and activities that make you want to smoke (triggers). Avoid them. What first steps can I take to quit smoking?  Throw away all cigarettes at home, at work, and in your car.  Throw away the things that you use when you smoke, such as ashtrays and lighters.  Clean your car. Make sure to empty the ashtray.  Clean your home, including curtains and carpets. What can I do to help me quit smoking? Talk with your doctor about taking medicines and seeing a counselor at the same time. You are more likely to succeed when you do both.  If you are pregnant or breastfeeding, talk with your doctor about counseling or other ways to quit smoking. Do not take medicine to help you quit smoking unless your doctor tells you to do so. To quit smoking: Quit right away  Quit smoking totally, instead of slowly cutting back on how much you smoke over a period of time.  Go to counseling. You are more likely to quit if you go to counseling sessions regularly. Take medicine You may take medicines to help you quit. Some  medicines need a prescription, and some you can buy over-the-counter. Some medicines may contain a drug called nicotine to replace the nicotine in cigarettes. Medicines may:  Help you to stop having the desire to smoke (cravings).  Help to stop the problems that come when you stop smoking (withdrawal symptoms). Your doctor may ask you to use:  Nicotine patches, gum, or lozenges.  Nicotine inhalers or sprays.  Non-nicotine medicine that is taken by mouth. Find resources Find resources and other ways to help you quit smoking and remain smoke-free after you quit. These resources are most helpful when you use them often. They include:  Online chats with a counselor.  Phone quitlines.  Printed self-help materials.  Support groups or group counseling.  Text messaging programs.  Mobile phone apps. Use apps on your mobile phone or tablet that can help you stick to your quit plan. There are many free apps for mobile phones and tablets as well as websites. Examples include Quit Guide from the CDC and smokefree.gov  What things can I do to make it easier to quit?   Talk to your family and friends. Ask them to support and encourage you.  Call a phone quitline (1-800-QUIT-NOW), reach out to support groups, or work with a counselor.  Ask people who smoke to not smoke around you.  Avoid places that make you want to smoke,   such as: ? Bars. ? Parties. ? Smoke-break areas at work.  Spend time with people who do not smoke.  Lower the stress in your life. Stress can make you want to smoke. Try these things to help your stress: ? Getting regular exercise. ? Doing deep-breathing exercises. ? Doing yoga. ? Meditating. ? Doing a body scan. To do this, close your eyes, focus on one area of your body at a time from head to toe. Notice which parts of your body are tense. Try to relax the muscles in those areas. How will I feel when I quit smoking? Day 1 to 3 weeks Within the first 24 hours,  you may start to have some problems that come from quitting tobacco. These problems are very bad 2-3 days after you quit, but they do not often last for more than 2-3 weeks. You may get these symptoms:  Mood swings.  Feeling restless, nervous, angry, or annoyed.  Trouble concentrating.  Dizziness.  Strong desire for high-sugar foods and nicotine.  Weight gain.  Trouble pooping (constipation).  Feeling like you may vomit (nausea).  Coughing or a sore throat.  Changes in how the medicines that you take for other issues work in your body.  Depression.  Trouble sleeping (insomnia). Week 3 and afterward After the first 2-3 weeks of quitting, you may start to notice more positive results, such as:  Better sense of smell and taste.  Less coughing and sore throat.  Slower heart rate.  Lower blood pressure.  Clearer skin.  Better breathing.  Fewer sick days. Quitting smoking can be hard. Do not give up if you fail the first time. Some people need to try a few times before they succeed. Do your best to stick to your quit plan, and talk with your doctor if you have any questions or concerns. Summary  Smoking tobacco is the leading cause of preventable death. Quitting smoking can be hard, but it is one of the best things that you can do for your health.  When you decide to quit smoking, make a plan to help you succeed.  Quit smoking right away, not slowly over a period of time.  When you start quitting, seek help from your doctor, family, or friends. This information is not intended to replace advice given to you by your health care provider. Make sure you discuss any questions you have with your health care provider. Document Released: 04/17/2009 Document Revised: 09/08/2018 Document Reviewed: 09/09/2018 Elsevier Patient Education  2020 Elsevier Inc.     Stroke Prevention Some medical conditions and lifestyle choices can lead to a higher risk for a stroke. You can  help to prevent a stroke by making nutrition, lifestyle, and other changes. What nutrition changes can be made?   Eat healthy foods. ? Choose foods that are high in fiber. These include:  Fresh fruits.  Fresh vegetables.  Whole grains. ? Eat at least 5 or more servings of fruits and vegetables each day. Try to fill half of your plate at each meal with fruits and vegetables. ? Choose lean protein foods. These include:  Lowfat (lean) cuts of meat.  Chicken without skin.  Fish.  Tofu.  Beans.  Nuts. ? Eat low-fat dairy products. ? Avoid foods that:  Are high in salt (sodium).  Have saturated fat.  Have trans fat.  Have cholesterol.  Are processed.  Are premade.  Follow eating guidelines as told by your doctor. These may include: ? Reducing how many calories you   eat and drink each day. ? Limiting how much salt you eat or drink each day to 1,500 milligrams (mg). ? Using only healthy fats for cooking. These include:  Olive oil.  Canola oil.  Sunflower oil. ? Counting how many carbohydrates you eat and drink each day. What lifestyle changes can be made?  Try to stay at a healthy weight. Talk to your doctor about what a good weight is for you.  Get at least 30 minutes of moderate physical activity at least 5 days a week. This can include: ? Fast walking. ? Biking. ? Swimming.  Do not use any products that have nicotine or tobacco. This includes cigarettes and e-cigarettes. If you need help quitting, ask your doctor. Avoid being around tobacco smoke in general.  Limit how much alcohol you drink to no more than 1 drink a day for nonpregnant women and 2 drinks a day for men. One drink equals 12 oz of beer, 5 oz of wine, or 1 oz of hard liquor.  Do not use drugs.  Avoid taking birth control pills. Talk to your doctor about the risks of taking birth control pills if: ? You are over 35 years old. ? You smoke. ? You get migraines. ? You have had a blood clot.  What other changes can be made?  Manage your cholesterol. ? It is important to eat a healthy diet. ? If your cholesterol cannot be managed through your diet, you may also need to take medicines. Take medicines as told by your doctor.  Manage your diabetes. ? It is important to eat a healthy diet and to exercise regularly. ? If your blood sugar cannot be managed through diet and exercise, you may need to take medicines. Take medicines as told by your doctor.  Control your high blood pressure (hypertension). ? Try to keep your blood pressure below 130/80. This can help lower your risk of stroke. ? It is important to eat a healthy diet and to exercise regularly. ? If your blood pressure cannot be managed through diet and exercise, you may need to take medicines. Take medicines as told by your doctor. ? Ask your doctor if you should check your blood pressure at home. ? Have your blood pressure checked every year. Do this even if your blood pressure is normal.  Talk to your doctor about getting checked for a sleep disorder. Signs of this can include: ? Snoring a lot. ? Feeling very tired.  Take over-the-counter and prescription medicines only as told by your doctor. These may include aspirin or blood thinners (antiplatelets or anticoagulants).  Make sure that any other medical conditions you have are managed. Where to find more information  American Stroke Association: www.strokeassociation.org  National Stroke Association: www.stroke.org Get help right away if:  You have any symptoms of stroke. "BE FAST" is an easy way to remember the main warning signs: ? B - Balance. Signs are dizziness, sudden trouble walking, or loss of balance. ? E - Eyes. Signs are trouble seeing or a sudden change in how you see. ? F - Face. Signs are sudden weakness or loss of feeling of the face, or the face or eyelid drooping on one side. ? A - Arms. Signs are weakness or loss of feeling in an arm. This  happens suddenly and usually on one side of the body. ? S - Speech. Signs are sudden trouble speaking, slurred speech, or trouble understanding what people say. ? T - Time. Time to call emergency   services. Write down what time symptoms started.  You have other signs of stroke, such as: ? A sudden, very bad headache with no known cause. ? Feeling sick to your stomach (nausea). ? Throwing up (vomiting). ? Jerky movements you cannot control (seizure). These symptoms may represent a serious problem that is an emergency. Do not wait to see if the symptoms will go away. Get medical help right away. Call your local emergency services (911 in the [place] Do not drive yourself to the hospital. Summary  You can prevent a stroke by eating healthy, exercising, not smoking, drinking less alcohol, and treating other health problems, such as diabetes, high blood pressure, or high cholesterol.  Do not use any products that contain nicotine or tobacco, such as cigarettes and e-cigarettes.  Get help right away if you have any signs or symptoms of a stroke. This information is not intended to replace advice given to you by your health care provider. Make sure you discuss any questions you have with your health care provider. Document Released: 12/21/2011 Document Revised: 08/17/2018 Document Reviewed: 09/22/2016 Elsevier Patient Education  2020 Elsevier Inc.  

## 2019-04-23 DIAGNOSIS — I10 Essential (primary) hypertension: Secondary | ICD-10-CM | POA: Diagnosis not present

## 2019-04-23 DIAGNOSIS — E1165 Type 2 diabetes mellitus with hyperglycemia: Secondary | ICD-10-CM | POA: Diagnosis not present

## 2019-04-23 DIAGNOSIS — E782 Mixed hyperlipidemia: Secondary | ICD-10-CM | POA: Diagnosis not present

## 2019-04-27 DIAGNOSIS — Z0000 Encounter for general adult medical examination without abnormal findings: Secondary | ICD-10-CM | POA: Diagnosis not present

## 2019-04-27 DIAGNOSIS — E1165 Type 2 diabetes mellitus with hyperglycemia: Secondary | ICD-10-CM | POA: Diagnosis not present

## 2019-04-27 DIAGNOSIS — I10 Essential (primary) hypertension: Secondary | ICD-10-CM | POA: Diagnosis not present

## 2019-04-27 DIAGNOSIS — I63232 Cerebral infarction due to unspecified occlusion or stenosis of left carotid arteries: Secondary | ICD-10-CM | POA: Diagnosis not present

## 2019-04-27 DIAGNOSIS — Z6823 Body mass index (BMI) 23.0-23.9, adult: Secondary | ICD-10-CM | POA: Diagnosis not present

## 2019-04-27 DIAGNOSIS — E782 Mixed hyperlipidemia: Secondary | ICD-10-CM | POA: Diagnosis not present

## 2019-04-27 DIAGNOSIS — F419 Anxiety disorder, unspecified: Secondary | ICD-10-CM | POA: Diagnosis not present

## 2019-10-29 DIAGNOSIS — E782 Mixed hyperlipidemia: Secondary | ICD-10-CM | POA: Diagnosis not present

## 2019-10-29 DIAGNOSIS — E1165 Type 2 diabetes mellitus with hyperglycemia: Secondary | ICD-10-CM | POA: Diagnosis not present

## 2019-10-29 DIAGNOSIS — I10 Essential (primary) hypertension: Secondary | ICD-10-CM | POA: Diagnosis not present

## 2019-10-29 DIAGNOSIS — F17210 Nicotine dependence, cigarettes, uncomplicated: Secondary | ICD-10-CM | POA: Diagnosis not present

## 2019-10-29 DIAGNOSIS — I63232 Cerebral infarction due to unspecified occlusion or stenosis of left carotid arteries: Secondary | ICD-10-CM | POA: Diagnosis not present

## 2019-11-02 DIAGNOSIS — E1165 Type 2 diabetes mellitus with hyperglycemia: Secondary | ICD-10-CM | POA: Diagnosis not present

## 2019-11-02 DIAGNOSIS — E875 Hyperkalemia: Secondary | ICD-10-CM | POA: Diagnosis not present

## 2019-11-02 DIAGNOSIS — F419 Anxiety disorder, unspecified: Secondary | ICD-10-CM | POA: Diagnosis not present

## 2019-11-02 DIAGNOSIS — I63232 Cerebral infarction due to unspecified occlusion or stenosis of left carotid arteries: Secondary | ICD-10-CM | POA: Diagnosis not present

## 2019-11-02 DIAGNOSIS — Z6823 Body mass index (BMI) 23.0-23.9, adult: Secondary | ICD-10-CM | POA: Diagnosis not present

## 2019-11-02 DIAGNOSIS — E782 Mixed hyperlipidemia: Secondary | ICD-10-CM | POA: Diagnosis not present

## 2019-11-02 DIAGNOSIS — I10 Essential (primary) hypertension: Secondary | ICD-10-CM | POA: Diagnosis not present

## 2020-02-29 ENCOUNTER — Other Ambulatory Visit: Payer: Self-pay | Admitting: *Deleted

## 2020-02-29 DIAGNOSIS — Z9889 Other specified postprocedural states: Secondary | ICD-10-CM

## 2020-03-13 NOTE — Progress Notes (Signed)
HISTORY AND PHYSICAL     CC:  follow up. Requesting Provider:  Royann Shivers, *  HPI: This is a 69 y.o. male here for follow up for carotid artery stenosis.  Pt is followed by Dr. Randie Heinz and was originally evaluated for carotid stenosis in October 2018.  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   He has hx of right ICA stenting in 2013 and a scar on the right neck suggestive of right CEA in the past.    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    Pt returns today for follow up.  Pt denies any amaurosis fugax, any new speech difficulties, weakness, numbness, paralysis or clumsiness.  He is accompanied by his daughter who states that he had a stroke in 2011 and was down for 48hrs.  Back then, he was not able to speak.  He does have weakness in his right leg and he does not have any use of the right arm.  He has been through extensive rehabilitation and is doing quite well.  He does continue to smoke 8 cigarettes per day.  His daughter states he used to smoke more and has a specific routine that he does every day.   He does not have any claudication or non healing wounds.   The pt is on a statin for cholesterol management.  The pt is not on a daily aspirin.   Other AC:  Plavix The pt is on ACEI, CCB for hypertension.   The pt is diabetic.   Tobacco hx:  Current-see above  Pt does not have family hx of AAA.  Past Medical History:  Diagnosis Date   Anxiety disorder    Cerebral infarction due to unspecified occlusion or stenosis of left carotid arteries (HCC)    Diabetes mellitus without complication (HCC)    Hyperlipidemia    Hypertension     Past Surgical History:  Procedure Laterality Date   CAROTID STENT INSERTION Right 2013    No Known Allergies  Current Outpatient Medications  Medication Sig Dispense Refill   amLODipine (NORVASC) 10 MG tablet Take 10 mg by mouth daily.      buPROPion (WELLBUTRIN XL) 150 MG 24 hr tablet Take 150 mg by mouth daily.     clopidogrel (PLAVIX) 75 MG tablet Take 75 mg by mouth daily.     lisinopril (PRINIVIL,ZESTRIL) 5 MG tablet Take 5 mg by mouth daily.     metFORMIN (GLUCOPHAGE) 500 MG tablet Take 500 mg by mouth 2 (two) times daily with a meal.     metoprolol tartrate (LOPRESSOR) 25 MG tablet Take 25 mg by mouth 2 (two) times daily.     simvastatin (ZOCOR) 20 MG tablet Take 20 mg by mouth daily.     No current facility-administered medications for this visit.    Family History  Problem Relation Age of Onset   Stroke Maternal Grandmother     Social History   Socioeconomic History   Marital status: Married    Spouse name: Not on file   Number of children: Not on file   Years of education: Not on file   Highest education level: Not on file  Occupational History   Not on file  Tobacco Use   Smoking status: Current Every Day Smoker    Types: Cigarettes   Smokeless tobacco: Current User   Tobacco comment: 35-40 years  Substance  and Sexual Activity   Alcohol use: No   Drug use: Not on file   Sexual activity: Not on file  Other Topics Concern   Not on file  Social History Narrative   Not on file   Social Determinants of Health   Financial Resource Strain:    Difficulty of Paying Living Expenses: Not on file  Food Insecurity:    Worried About Running Out of Food in the Last Year: Not on file   Ran Out of Food in the Last Year: Not on file  Transportation Needs:    Lack of Transportation (Medical): Not on file   Lack of Transportation (Non-Medical): Not on file  Physical Activity:    Days of Exercise per Week: Not on file   Minutes of Exercise per Session: Not on file  Stress:    Feeling of Stress : Not on file  Social Connections:    Frequency of Communication with Friends and Family: Not on file   Frequency of Social Gatherings with Friends and Family: Not on file   Attends  Religious Services: Not on file   Active Member of Clubs or Organizations: Not on file   Attends Banker Meetings: Not on file   Marital Status: Not on file  Intimate Partner Violence:    Fear of Current or Ex-Partner: Not on file   Emotionally Abused: Not on file   Physically Abused: Not on file   Sexually Abused: Not on file     REVIEW OF SYSTEMS:   [X]  denotes positive finding, [ ]  denotes negative finding Cardiac  Comments:  Chest pain or chest pressure:    Shortness of breath upon exertion:    Short of breath when lying flat:    Irregular heart rhythm:        Vascular    Pain in calf, thigh, or hip brought on by ambulation:    Pain in feet at night that wakes you up from your sleep:     Blood clot in your veins:    Leg swelling:         Pulmonary    Oxygen at home:    Productive cough:     Wheezing:         Neurologic    Sudden weakness in arms or legs:     Sudden numbness in arms or legs:     Sudden onset of difficulty speaking or slurred speech:    Temporary loss of vision in one eye:     Problems with dizziness:         Gastrointestinal    Blood in stool:     Vomited blood:         Genitourinary    Burning when urinating:     Blood in urine:        Psychiatric    Major depression:         Hematologic    Bleeding problems:    Problems with blood clotting too easily:        Skin    Rashes or ulcers:        Constitutional    Fever or chills:      PHYSICAL EXAMINATION:  Today's Vitals   03/14/20 1327  BP: 134/79  Pulse: 65  Resp: 20  Temp: 98.4 F (36.9 C)  TempSrc: Temporal  SpO2: 97%  Height: 5\' 8"  (1.727 m)   Body mass index is 23.92 kg/m.   General:  WDWN in NAD; vital  signs documented above Gait: Not observed HENT: WNL, normocephalic Pulmonary: normal non-labored breathing Cardiac: regular HR, without murmur without carotid bruits Abdomen: soft, NT, no masses Skin: without rashes Vascular Exam/Pulses:   Right Left  Radial 2+ (normal) 2+ (normal)  Ulnar 2+ (normal) Unable to palpate  Popliteal Unable to palpate Unable to palpate  DP Monophasic doppler Brisk biphasic doppler  PT Monophasic doppler Brisk biphasic doppler  Peroneal absent Brisk biphasic doppler   Extremities: without ischemic changes, without Gangrene , without cellulitis; without open wounds;  Musculoskeletal: no muscle wasting or atrophy  Neurologic: A&O X 3 Psychiatric:  The pt has Normal affect.   Non-Invasive Vascular Imaging:   Carotid Duplex on 03/14/2020: Right Carotid: Patent stent with no stenosis. Left:  occluded Subclavians: Normal flow hemodynamics were seen in bilateral subclavian arteries.   Previous Carotid duplex on 01/15/2019: Right: no evidence of restenosis  Left:   Occluded ICA    ASSESSMENT/PLAN:: 69 y.o. male here for follow up carotid artery stenosis.  Carotid stenosis -duplex today reveals the duplex is essentially unchanged.  He has not had any new stroke sx and is doing quite well.    PAD -pt with monophasic doppler signals right foot and brisk biphasic doppler signals left foot.  Will check ABI's when pt returns at next visit to get a baseline.  Discussed that if he develops any non healing wounds or rest pain, he should return to see [place] sooner.    Smoker -pt currently smoking 8 cigarettes / day.  Discussed importance of smoking cessation with he and his daughter and it is especially important given he is diabetic, which is well controlled.   -pt will f/u in one year with ABI's and carotid duplex -continue statin/plavix -pt will call sooner should they have any issues.   Doreatha Massed, [facility] [phone]  Clinic MD:  Randie Heinz

## 2020-03-14 ENCOUNTER — Other Ambulatory Visit: Payer: Self-pay

## 2020-03-14 ENCOUNTER — Ambulatory Visit: Payer: Medicare HMO | Admitting: Physician Assistant

## 2020-03-14 ENCOUNTER — Ambulatory Visit (HOSPITAL_COMMUNITY)
Admission: RE | Admit: 2020-03-14 | Discharge: 2020-03-14 | Disposition: A | Discharge status: Home / Self Care | Payer: Medicare HMO | Source: Ambulatory Visit | Attending: Vascular Surgery | Admitting: Vascular Surgery

## 2020-03-14 VITALS — BP 134/79 | HR 65 | Temp 98.4°F | Resp 20 | Ht 68.0 in

## 2020-03-14 DIAGNOSIS — I739 Peripheral vascular disease, unspecified: Secondary | ICD-10-CM | POA: Diagnosis not present

## 2020-03-14 DIAGNOSIS — F17200 Nicotine dependence, unspecified, uncomplicated: Secondary | ICD-10-CM | POA: Diagnosis not present

## 2020-03-14 DIAGNOSIS — Z9889 Other specified postprocedural states: Secondary | ICD-10-CM | POA: Insufficient documentation

## 2020-04-28 DIAGNOSIS — E1165 Type 2 diabetes mellitus with hyperglycemia: Secondary | ICD-10-CM | POA: Diagnosis not present

## 2020-04-28 DIAGNOSIS — E875 Hyperkalemia: Secondary | ICD-10-CM | POA: Diagnosis not present

## 2020-04-28 DIAGNOSIS — F17210 Nicotine dependence, cigarettes, uncomplicated: Secondary | ICD-10-CM | POA: Diagnosis not present

## 2020-04-28 DIAGNOSIS — I10 Essential (primary) hypertension: Secondary | ICD-10-CM | POA: Diagnosis not present

## 2020-04-28 DIAGNOSIS — E782 Mixed hyperlipidemia: Secondary | ICD-10-CM | POA: Diagnosis not present

## 2020-04-28 DIAGNOSIS — F419 Anxiety disorder, unspecified: Secondary | ICD-10-CM | POA: Diagnosis not present

## 2020-05-02 DIAGNOSIS — Z0000 Encounter for general adult medical examination without abnormal findings: Secondary | ICD-10-CM | POA: Diagnosis not present

## 2020-05-02 DIAGNOSIS — E782 Mixed hyperlipidemia: Secondary | ICD-10-CM | POA: Diagnosis not present

## 2020-05-02 DIAGNOSIS — Z1389 Encounter for screening for other disorder: Secondary | ICD-10-CM | POA: Diagnosis not present

## 2020-05-02 DIAGNOSIS — Z1331 Encounter for screening for depression: Secondary | ICD-10-CM | POA: Diagnosis not present

## 2020-05-02 DIAGNOSIS — I10 Essential (primary) hypertension: Secondary | ICD-10-CM | POA: Diagnosis not present

## 2020-05-02 DIAGNOSIS — E1165 Type 2 diabetes mellitus with hyperglycemia: Secondary | ICD-10-CM | POA: Diagnosis not present

## 2020-05-02 DIAGNOSIS — E875 Hyperkalemia: Secondary | ICD-10-CM | POA: Diagnosis not present

## 2020-05-02 DIAGNOSIS — I63232 Cerebral infarction due to unspecified occlusion or stenosis of left carotid arteries: Secondary | ICD-10-CM | POA: Diagnosis not present

## 2020-10-27 DIAGNOSIS — E1165 Type 2 diabetes mellitus with hyperglycemia: Secondary | ICD-10-CM | POA: Diagnosis not present

## 2020-10-27 DIAGNOSIS — E782 Mixed hyperlipidemia: Secondary | ICD-10-CM | POA: Diagnosis not present

## 2020-10-27 DIAGNOSIS — R5383 Other fatigue: Secondary | ICD-10-CM | POA: Diagnosis not present

## 2020-10-27 DIAGNOSIS — R739 Hyperglycemia, unspecified: Secondary | ICD-10-CM | POA: Diagnosis not present

## 2020-10-27 DIAGNOSIS — E7849 Other hyperlipidemia: Secondary | ICD-10-CM | POA: Diagnosis not present

## 2020-10-31 DIAGNOSIS — F419 Anxiety disorder, unspecified: Secondary | ICD-10-CM | POA: Diagnosis not present

## 2020-10-31 DIAGNOSIS — I63232 Cerebral infarction due to unspecified occlusion or stenosis of left carotid arteries: Secondary | ICD-10-CM | POA: Diagnosis not present

## 2020-10-31 DIAGNOSIS — D692 Other nonthrombocytopenic purpura: Secondary | ICD-10-CM | POA: Diagnosis not present

## 2020-10-31 DIAGNOSIS — E1165 Type 2 diabetes mellitus with hyperglycemia: Secondary | ICD-10-CM | POA: Diagnosis not present

## 2020-10-31 DIAGNOSIS — Z6823 Body mass index (BMI) 23.0-23.9, adult: Secondary | ICD-10-CM | POA: Diagnosis not present

## 2020-10-31 DIAGNOSIS — I10 Essential (primary) hypertension: Secondary | ICD-10-CM | POA: Diagnosis not present

## 2020-10-31 DIAGNOSIS — E7849 Other hyperlipidemia: Secondary | ICD-10-CM | POA: Diagnosis not present

## 2020-12-03 ENCOUNTER — Other Ambulatory Visit: Payer: Self-pay

## 2020-12-03 DIAGNOSIS — I739 Peripheral vascular disease, unspecified: Secondary | ICD-10-CM

## 2020-12-03 DIAGNOSIS — Z9889 Other specified postprocedural states: Secondary | ICD-10-CM

## 2020-12-03 DIAGNOSIS — I63232 Cerebral infarction due to unspecified occlusion or stenosis of left carotid arteries: Secondary | ICD-10-CM

## 2021-03-18 ENCOUNTER — Ambulatory Visit (HOSPITAL_COMMUNITY)
Admission: RE | Admit: 2021-03-18 | Discharge: 2021-03-18 | Disposition: A | Discharge status: Home / Self Care | Payer: Medicare HMO | Source: Ambulatory Visit | Attending: Vascular Surgery | Admitting: Vascular Surgery

## 2021-03-18 ENCOUNTER — Ambulatory Visit: Payer: Medicare HMO | Admitting: Physician Assistant

## 2021-03-18 ENCOUNTER — Ambulatory Visit (INDEPENDENT_AMBULATORY_CARE_PROVIDER_SITE_OTHER)
Admission: RE | Admit: 2021-03-18 | Discharge: 2021-03-18 | Disposition: A | Discharge status: Home / Self Care | Payer: Medicare HMO | Source: Ambulatory Visit | Attending: Vascular Surgery | Admitting: Vascular Surgery

## 2021-03-18 ENCOUNTER — Other Ambulatory Visit: Payer: Self-pay

## 2021-03-18 VITALS — BP 142/78 | HR 74 | Temp 97.5°F | Resp 18 | Ht 68.0 in | Wt 149.0 lb

## 2021-03-18 DIAGNOSIS — Z9889 Other specified postprocedural states: Secondary | ICD-10-CM

## 2021-03-18 DIAGNOSIS — I739 Peripheral vascular disease, unspecified: Secondary | ICD-10-CM | POA: Insufficient documentation

## 2021-03-18 DIAGNOSIS — I63232 Cerebral infarction due to unspecified occlusion or stenosis of left carotid arteries: Secondary | ICD-10-CM | POA: Diagnosis not present

## 2021-03-18 NOTE — Progress Notes (Signed)
Carotid Artery Follow-Up   VASCULAR SURGERY ASSESSMENT & PLAN:   Darren Chung is a 70 y.o. 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  He has hx of right ICA stenting in 2013 and a scar on the right neck suggestive of right CEA in the past.    Bilateral carotid artery stenosis: Patent right carotid stent. The patient has no symptoms referable to carotid artery stenosis.  Duplex examination today is stable as compared to 1 year ago.  We reviewed the signs and symptoms of stroke/TIA and advised the patient to call EMS should these occur.    ABIs c/w moderate PAD. No claudication or tissue loss.  Continue optimal medical management of hypertension and follow-up with primary care physician. Encouraged complete smoking cessation. Continue the following medications: Plavix, statin Follow-up in 1 year with carotid duplex ultrasound.  SUBJECTIVE:   He has residual right lower extremity weakness and right elbow contracture. Moderate dysarthria. The patient denies monocular blindness, facial drooping. PHYSICAL EXAM:   Vitals:   03/18/21 1422  BP: (!) 142/78  Pulse: 74  Resp: 18  Temp: (!) 97.5 F (36.4 C)  SpO2: 98%     General appearance: Well-developed, well-nourished in no apparent distress. Able to ambulate unaided. Neurologic: Alert and oriented x4, face symmetric, moderate dysarthria Cardiovascular: Heart rate and rhythm are regular. 2+ left DP pulse, ? trace right DP, PT pulses  No carotid bruits. Respirations: Nonlabored Abdomen: No palpable pulsatile mass   NON-INVASIVE VASCULAR STUDIES  03/18/2021 ABI Findings:  +---------+------------------+-----+----------+--------+  Right    Rt Pressure (mmHg)IndexWaveform  Comment   +---------+------------------+-----+----------+--------+  PTA      89                0.63 monophasic          +---------+------------------+-----+----------+--------+  DP       76                0.54 monophasic           +---------+------------------+-----+----------+--------+  Great Toe61                0.43 Abnormal            +---------+------------------+-----+----------+--------+   +---------+------------------+-----+--------+-------+  Left     Lt Pressure (mmHg)IndexWaveformComment  +---------+------------------+-----+--------+-------+  Brachial 141                                     +---------+------------------+-----+--------+-------+  PTA      108               0.77 biphasic         +---------+------------------+-----+--------+-------+  DP       90                0.64 biphasic         +---------+------------------+-----+--------+-------+  Great Toe82                0.58 Abnormal         +---------+------------------+-----+--------+-------+  Summary:  Right Carotid: Patent ICA stent <50%.   Left Carotid: Evidence consistent with a total occlusion of the left ICA.      *See table(s) above for measurements and observations.       Preliminary    PROBLEM LIST:    The patient's past medical history, past surgical history, family history, social history, allergy list and medication list  are reviewed.   CURRENT MEDS:    Current Outpatient Medications:    amLODipine (NORVASC) 10 MG tablet, Take 10 mg by mouth daily., Disp: , Rfl:    buPROPion (WELLBUTRIN XL) 150 MG 24 hr tablet, Take 150 mg by mouth daily., Disp: , Rfl:    clopidogrel (PLAVIX) 75 MG tablet, Take 75 mg by mouth daily., Disp: , Rfl:    lisinopril (PRINIVIL,ZESTRIL) 5 MG tablet, Take 5 mg by mouth daily., Disp: , Rfl:    metFORMIN (GLUCOPHAGE) 500 MG tablet, Take 500 mg by mouth 2 (two) times daily with a meal., Disp: , Rfl:    metoprolol tartrate (LOPRESSOR) 25 MG tablet, Take 25 mg by mouth 2 (two) times daily., Disp: , Rfl:    simvastatin (ZOCOR) 20 MG tablet, Take 20 mg by mouth daily., Disp: , Rfl:    REVIEW OF SYSTEMS:   [X]  denotes positive finding, [ ]  denotes negative  finding Cardiac  Comments:  Chest pain or chest pressure:    Shortness of breath upon exertion:    Short of breath when lying flat:    Irregular heart rhythm:        Vascular    Pain in calf, thigh, or hip brought on by ambulation:    Pain in feet at night that wakes you up from your sleep:     Blood clot in your veins:    Leg swelling:         Pulmonary    Oxygen at home:    Productive cough:     Wheezing:         Neurologic    Sudden weakness in arms or legs:     Sudden numbness in arms or legs:     Sudden onset of difficulty speaking or slurred speech:    Temporary loss of vision in one eye:     Problems with dizziness:         Gastrointestinal    Blood in stool:     Vomited blood:         Genitourinary    Burning when urinating:     Blood in urine:        Psychiatric    Major depression:         Hematologic    Bleeding problems:    Problems with blood clotting too easily:        Skin    Rashes or ulcers:        Constitutional    Fever or chills:     , PA-C  Office: [phone] 03/18/2021 Dr. [phone]

## 2021-05-01 DIAGNOSIS — E7849 Other hyperlipidemia: Secondary | ICD-10-CM | POA: Diagnosis not present

## 2021-05-01 DIAGNOSIS — I10 Essential (primary) hypertension: Secondary | ICD-10-CM | POA: Diagnosis not present

## 2021-05-01 DIAGNOSIS — R5383 Other fatigue: Secondary | ICD-10-CM | POA: Diagnosis not present

## 2021-05-01 DIAGNOSIS — E875 Hyperkalemia: Secondary | ICD-10-CM | POA: Diagnosis not present

## 2021-05-01 DIAGNOSIS — E782 Mixed hyperlipidemia: Secondary | ICD-10-CM | POA: Diagnosis not present

## 2021-05-01 DIAGNOSIS — E1165 Type 2 diabetes mellitus with hyperglycemia: Secondary | ICD-10-CM | POA: Diagnosis not present

## 2021-05-05 DIAGNOSIS — E1165 Type 2 diabetes mellitus with hyperglycemia: Secondary | ICD-10-CM | POA: Diagnosis not present

## 2021-05-05 DIAGNOSIS — D692 Other nonthrombocytopenic purpura: Secondary | ICD-10-CM | POA: Diagnosis not present

## 2021-05-05 DIAGNOSIS — F419 Anxiety disorder, unspecified: Secondary | ICD-10-CM | POA: Diagnosis not present

## 2021-05-05 DIAGNOSIS — I69359 Hemiplegia and hemiparesis following cerebral infarction affecting unspecified side: Secondary | ICD-10-CM | POA: Diagnosis not present

## 2021-05-05 DIAGNOSIS — E7849 Other hyperlipidemia: Secondary | ICD-10-CM | POA: Diagnosis not present

## 2021-05-05 DIAGNOSIS — I10 Essential (primary) hypertension: Secondary | ICD-10-CM | POA: Diagnosis not present

## 2021-08-17 DIAGNOSIS — D692 Other nonthrombocytopenic purpura: Secondary | ICD-10-CM | POA: Diagnosis not present

## 2021-08-17 DIAGNOSIS — I10 Essential (primary) hypertension: Secondary | ICD-10-CM | POA: Diagnosis not present

## 2021-08-17 DIAGNOSIS — E782 Mixed hyperlipidemia: Secondary | ICD-10-CM | POA: Diagnosis not present

## 2021-08-17 DIAGNOSIS — E1165 Type 2 diabetes mellitus with hyperglycemia: Secondary | ICD-10-CM | POA: Diagnosis not present

## 2021-08-17 DIAGNOSIS — I69359 Hemiplegia and hemiparesis following cerebral infarction affecting unspecified side: Secondary | ICD-10-CM | POA: Diagnosis not present

## 2021-08-17 DIAGNOSIS — E7849 Other hyperlipidemia: Secondary | ICD-10-CM | POA: Diagnosis not present

## 2021-08-17 DIAGNOSIS — F419 Anxiety disorder, unspecified: Secondary | ICD-10-CM | POA: Diagnosis not present

## 2021-08-17 DIAGNOSIS — Z6822 Body mass index (BMI) 22.0-22.9, adult: Secondary | ICD-10-CM | POA: Diagnosis not present

## 2022-02-12 DIAGNOSIS — R5383 Other fatigue: Secondary | ICD-10-CM | POA: Diagnosis not present

## 2022-02-12 DIAGNOSIS — E875 Hyperkalemia: Secondary | ICD-10-CM | POA: Diagnosis not present

## 2022-02-12 DIAGNOSIS — E1165 Type 2 diabetes mellitus with hyperglycemia: Secondary | ICD-10-CM | POA: Diagnosis not present

## 2022-02-19 DIAGNOSIS — I69359 Hemiplegia and hemiparesis following cerebral infarction affecting unspecified side: Secondary | ICD-10-CM | POA: Diagnosis not present

## 2022-02-19 DIAGNOSIS — I10 Essential (primary) hypertension: Secondary | ICD-10-CM | POA: Diagnosis not present

## 2022-02-19 DIAGNOSIS — E7849 Other hyperlipidemia: Secondary | ICD-10-CM | POA: Diagnosis not present

## 2022-02-19 DIAGNOSIS — F419 Anxiety disorder, unspecified: Secondary | ICD-10-CM | POA: Diagnosis not present

## 2022-02-19 DIAGNOSIS — Z6823 Body mass index (BMI) 23.0-23.9, adult: Secondary | ICD-10-CM | POA: Diagnosis not present

## 2022-02-19 DIAGNOSIS — E1165 Type 2 diabetes mellitus with hyperglycemia: Secondary | ICD-10-CM | POA: Diagnosis not present

## 2022-02-19 DIAGNOSIS — D692 Other nonthrombocytopenic purpura: Secondary | ICD-10-CM | POA: Diagnosis not present

## 2022-02-19 DIAGNOSIS — Z0001 Encounter for general adult medical examination with abnormal findings: Secondary | ICD-10-CM | POA: Diagnosis not present

## 2022-08-10 ENCOUNTER — Other Ambulatory Visit: Payer: Self-pay | Admitting: *Deleted

## 2022-08-10 DIAGNOSIS — Z9889 Other specified postprocedural states: Secondary | ICD-10-CM

## 2022-08-20 DIAGNOSIS — F419 Anxiety disorder, unspecified: Secondary | ICD-10-CM | POA: Diagnosis not present

## 2022-08-20 DIAGNOSIS — Z6823 Body mass index (BMI) 23.0-23.9, adult: Secondary | ICD-10-CM | POA: Diagnosis not present

## 2022-08-20 DIAGNOSIS — D692 Other nonthrombocytopenic purpura: Secondary | ICD-10-CM | POA: Diagnosis not present

## 2022-08-20 DIAGNOSIS — I10 Essential (primary) hypertension: Secondary | ICD-10-CM | POA: Diagnosis not present

## 2022-08-20 DIAGNOSIS — E1165 Type 2 diabetes mellitus with hyperglycemia: Secondary | ICD-10-CM | POA: Diagnosis not present

## 2022-08-20 DIAGNOSIS — I69359 Hemiplegia and hemiparesis following cerebral infarction affecting unspecified side: Secondary | ICD-10-CM | POA: Diagnosis not present

## 2022-08-20 DIAGNOSIS — E7849 Other hyperlipidemia: Secondary | ICD-10-CM | POA: Diagnosis not present

## 2022-08-27 ENCOUNTER — Ambulatory Visit: Payer: Medicare HMO | Admitting: Physician Assistant

## 2022-08-27 ENCOUNTER — Encounter: Payer: Self-pay | Admitting: Physician Assistant

## 2022-08-27 ENCOUNTER — Ambulatory Visit (HOSPITAL_COMMUNITY)
Admission: RE | Admit: 2022-08-27 | Discharge: 2022-08-27 | Disposition: A | Discharge status: Home / Self Care | Payer: Medicare HMO | Source: Ambulatory Visit | Attending: Vascular Surgery | Admitting: Vascular Surgery

## 2022-08-27 VITALS — BP 128/75 | HR 71 | Temp 98.2°F | Resp 20 | Ht 68.0 in | Wt 153.5 lb

## 2022-08-27 DIAGNOSIS — I6521 Occlusion and stenosis of right carotid artery: Secondary | ICD-10-CM

## 2022-08-27 DIAGNOSIS — I6523 Occlusion and stenosis of bilateral carotid arteries: Secondary | ICD-10-CM

## 2022-08-27 DIAGNOSIS — I6522 Occlusion and stenosis of left carotid artery: Secondary | ICD-10-CM

## 2022-08-27 DIAGNOSIS — Z9889 Other specified postprocedural states: Secondary | ICD-10-CM | POA: Insufficient documentation

## 2022-08-27 DIAGNOSIS — F17200 Nicotine dependence, unspecified, uncomplicated: Secondary | ICD-10-CM

## 2022-08-27 NOTE — Progress Notes (Signed)
HISTORY AND PHYSICAL     CC:  follow up. Requesting Provider:  Rosalee Kaufman, *  HPI: This is a 72 y.o. male here for follow up for carotid artery stenosis.   Pt is followed by Dr. Donzetta Matters and was originally evaluated for carotid stenosis in October 2018.  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   He has hx of right ICA stenting in 2013 and a scar on the right neck suggestive of right CEA in the past.     Pt was last seen 03/18/2021 and at that time he had residual right lower extremity weakness and right elbow contracture.  He did not have any monocular blindness or facial droop.  His right ICA stent was patent.  His ABI's were consistent with moderate PAD but not having any claudication or tissue loss.    Pt returns today for follow up and here with his daughter.    Pt denies any amaurosis fugax, speech difficulties, any new weakness, numbness, paralysis or clumsiness or facial droop.  He states he is still smoking but has cut down from 8 cigarettes to 7 cigarettes per day.  He is complaint with his plavix and statin.  He denies any pain in his feet or non healing wounds.   The pt is on a statin for cholesterol management.  The pt is not on a daily aspirin.   Other AC:  Plavix The pt is on CCB, BB, ACEI for hypertension.   The pt does have diabetes Tobacco hx:  current  Pt does not have family hx of AAA.  Past Medical History:  Diagnosis Date   Anxiety disorder    Cerebral infarction due to unspecified occlusion or stenosis of left carotid arteries (HCC)    Diabetes mellitus without complication ([place])    Hyperlipidemia    Hypertension     Past Surgical History:  Procedure Laterality Date   CAROTID STENT INSERTION Right 2013    No Known Allergies  Current Outpatient Medications  Medication Sig Dispense Refill   amLODipine (NORVASC) 10 MG tablet Take 10 mg by mouth daily.     buPROPion (WELLBUTRIN XL) 150 MG 24 hr tablet Take 150 mg by mouth daily.      clopidogrel (PLAVIX) 75 MG tablet Take 75 mg by mouth daily.     lisinopril (PRINIVIL,ZESTRIL) 5 MG tablet Take 5 mg by mouth daily.     metFORMIN (GLUCOPHAGE) 500 MG tablet Take 500 mg by mouth 2 (two) times daily with a meal.     metoprolol tartrate (LOPRESSOR) 25 MG tablet Take 25 mg by mouth 2 (two) times daily.     simvastatin (ZOCOR) 20 MG tablet Take 20 mg by mouth daily.     No current facility-administered medications for this visit.    Family History  Problem Relation Age of Onset   Stroke Maternal Grandmother     Social History   Socioeconomic History   Marital status: Married    Spouse name: Not on file   Number of children: Not on file   Years of education: Not on file   Highest education level: Not on file  Occupational History   Not on file  Tobacco Use   Smoking status: Every Day    Types: Cigarettes   Smokeless tobacco: Current   Tobacco comments:    35-40 years  Substance and Sexual Activity   Alcohol use: No   Drug use: Not on file   Sexual activity:  Not on file  Other Topics Concern   Not on file  Social History Narrative   Not on file   Social Determinants of Health   Financial Resource Strain: Not on file  Food Insecurity: Not on file  Transportation Needs: Not on file  Physical Activity: Not on file  Stress: Not on file  Social Connections: Not on file  Intimate Partner Violence: Not on file     REVIEW OF SYSTEMS:   '[X]'$  denotes positive finding, '[ ]'$  denotes negative finding Cardiac  Comments:  Chest pain or chest pressure:    Shortness of breath upon exertion:    Short of breath when lying flat:    Irregular heart rhythm:        Vascular    Pain in calf, thigh, or hip brought on by ambulation:    Pain in feet at night that wakes you up from your sleep:     Blood clot in your veins:    Leg swelling:         Pulmonary    Oxygen at home:    Productive cough:     Wheezing:         Neurologic    Sudden weakness in arms or  legs:     Sudden numbness in arms or legs:     Sudden onset of difficulty speaking or slurred speech:    Temporary loss of vision in one eye:     Problems with dizziness:         Gastrointestinal    Blood in stool:     Vomited blood:         Genitourinary    Burning when urinating:     Blood in urine:        Psychiatric    Major depression:         Hematologic    Bleeding problems:    Problems with blood clotting too easily:        Skin    Rashes or ulcers:        Constitutional    Fever or chills:      PHYSICAL EXAMINATION:  Today's Vitals   08/27/22 1024 08/27/22 1027  BP: 129/82 128/75  Pulse: 71   Resp: 20   Temp: 98.2 F (36.8 C)   SpO2: 97%   Weight: 153 lb 8 oz (69.6 kg)   Height: '5\' 8"'$  (1.727 m)    Body mass index is 23.34 kg/m.   General:  WDWN in NAD; vital signs documented above Gait: Not observed HENT: WNL, normocephalic Pulmonary: normal non-labored breathing Cardiac: regular HR, without carotid bruits Abdomen: soft, NT; aortic pulse is not palpable Skin:  well healed right neck scar Vascular Exam/Pulses:  Right Left  Radial 2+ (normal) 2+ (normal)   Extremities: without open wounds Musculoskeletal: no muscle wasting or atrophy  Neurologic: A&O X 3; moving all extremities equally; speech is fluent/normal Psychiatric:  The pt has Normal affect.   Non-Invasive Vascular Imaging:   Carotid Duplex on 08/27/2022 Right:  stent without stenosis Left:  occluded Vertebrals:  Bilateral vertebral arteries demonstrate antegrade flow.  Subclavians: Normal flow hemodynamics were seen in bilateral subclavian  arteries   Previous Carotid duplex on 03/18/2021: Right: patent ICA stent < 50% stenosis Left:   occluded    ASSESSMENT/PLAN:: 72 y.o. male here for follow up carotid artery stenosis and  Pt is followed by Dr. Donzetta Matters and was originally evaluated for carotid stenosis in October 2018.  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   He  has hx of right ICA stenting in 2013 and a scar on the right neck suggestive of right CEA in the past.     -duplex today reveals patent right ICA stent and known occluded left ICA.  He remains asymptomatic.  -discussed s/s of stroke with pt and he understands should he develop any of these sx, he will go to the nearest ER or call 911. -pt will f/u in one year with carotid duplex -pt will call sooner should he have any issues. -continue statin/plavix -discussed smoking cessation and importance of quitting.     Leontine Locket, [facility] [phone]  Clinic MD:  Virl Cagey

## 2022-11-20 DIAGNOSIS — S51011A Laceration without foreign body of right elbow, initial encounter: Secondary | ICD-10-CM | POA: Diagnosis not present

## 2022-11-20 DIAGNOSIS — S59901A Unspecified injury of right elbow, initial encounter: Secondary | ICD-10-CM | POA: Diagnosis not present

## 2022-11-20 DIAGNOSIS — Z6823 Body mass index (BMI) 23.0-23.9, adult: Secondary | ICD-10-CM | POA: Diagnosis not present

## 2022-11-23 DIAGNOSIS — T798XXA Other early complications of trauma, initial encounter: Secondary | ICD-10-CM | POA: Diagnosis not present

## 2022-11-23 DIAGNOSIS — S51012D Laceration without foreign body of left elbow, subsequent encounter: Secondary | ICD-10-CM | POA: Diagnosis not present

## 2022-11-23 DIAGNOSIS — Z6824 Body mass index (BMI) 24.0-24.9, adult: Secondary | ICD-10-CM | POA: Diagnosis not present

## 2022-11-26 DIAGNOSIS — Z6823 Body mass index (BMI) 23.0-23.9, adult: Secondary | ICD-10-CM | POA: Diagnosis not present

## 2022-11-26 DIAGNOSIS — L03113 Cellulitis of right upper limb: Secondary | ICD-10-CM | POA: Diagnosis not present

## 2023-03-25 DIAGNOSIS — F17210 Nicotine dependence, cigarettes, uncomplicated: Secondary | ICD-10-CM | POA: Diagnosis not present

## 2023-03-25 DIAGNOSIS — Z0001 Encounter for general adult medical examination with abnormal findings: Secondary | ICD-10-CM | POA: Diagnosis not present

## 2023-03-25 DIAGNOSIS — I10 Essential (primary) hypertension: Secondary | ICD-10-CM | POA: Diagnosis not present

## 2023-03-25 DIAGNOSIS — E1165 Type 2 diabetes mellitus with hyperglycemia: Secondary | ICD-10-CM | POA: Diagnosis not present

## 2023-03-25 DIAGNOSIS — E7849 Other hyperlipidemia: Secondary | ICD-10-CM | POA: Diagnosis not present

## 2023-04-04 DIAGNOSIS — R5383 Other fatigue: Secondary | ICD-10-CM | POA: Diagnosis not present

## 2023-04-04 DIAGNOSIS — D692 Other nonthrombocytopenic purpura: Secondary | ICD-10-CM | POA: Diagnosis not present

## 2023-04-04 DIAGNOSIS — E782 Mixed hyperlipidemia: Secondary | ICD-10-CM | POA: Diagnosis not present

## 2023-04-04 DIAGNOSIS — I69359 Hemiplegia and hemiparesis following cerebral infarction affecting unspecified side: Secondary | ICD-10-CM | POA: Diagnosis not present

## 2023-04-04 DIAGNOSIS — I10 Essential (primary) hypertension: Secondary | ICD-10-CM | POA: Diagnosis not present

## 2023-04-04 DIAGNOSIS — E7849 Other hyperlipidemia: Secondary | ICD-10-CM | POA: Diagnosis not present

## 2023-04-04 DIAGNOSIS — F17210 Nicotine dependence, cigarettes, uncomplicated: Secondary | ICD-10-CM | POA: Diagnosis not present

## 2023-04-04 DIAGNOSIS — F419 Anxiety disorder, unspecified: Secondary | ICD-10-CM | POA: Diagnosis not present

## 2023-04-04 DIAGNOSIS — E1165 Type 2 diabetes mellitus with hyperglycemia: Secondary | ICD-10-CM | POA: Diagnosis not present

## 2023-04-04 DIAGNOSIS — Z6823 Body mass index (BMI) 23.0-23.9, adult: Secondary | ICD-10-CM | POA: Diagnosis not present

## 2023-04-22 DIAGNOSIS — Z122 Encounter for screening for malignant neoplasm of respiratory organs: Secondary | ICD-10-CM | POA: Diagnosis not present

## 2023-04-22 DIAGNOSIS — F17210 Nicotine dependence, cigarettes, uncomplicated: Secondary | ICD-10-CM | POA: Diagnosis not present

## 2023-10-03 DIAGNOSIS — Z6821 Body mass index (BMI) 21.0-21.9, adult: Secondary | ICD-10-CM | POA: Diagnosis not present

## 2023-10-03 DIAGNOSIS — E1165 Type 2 diabetes mellitus with hyperglycemia: Secondary | ICD-10-CM | POA: Diagnosis not present

## 2023-10-03 DIAGNOSIS — Z8673 Personal history of transient ischemic attack (TIA), and cerebral infarction without residual deficits: Secondary | ICD-10-CM | POA: Diagnosis not present

## 2023-10-03 DIAGNOSIS — F419 Anxiety disorder, unspecified: Secondary | ICD-10-CM | POA: Diagnosis not present

## 2023-10-03 DIAGNOSIS — I10 Essential (primary) hypertension: Secondary | ICD-10-CM | POA: Diagnosis not present

## 2024-04-05 NOTE — Progress Notes (Signed)
 Darren Chung                                          MRN: [id number]   04/05/2024   The [facility] Quality Team Specialist reviewed this patient medical record for the purposes of chart review for care gap closure. The following were reviewed: chart review for care gap closure-glycemic status assessment.    [facility] Quality Team

## 2024-04-06 DIAGNOSIS — Z0000 Encounter for general adult medical examination without abnormal findings: Secondary | ICD-10-CM | POA: Diagnosis not present

## 2024-04-06 DIAGNOSIS — Z0001 Encounter for general adult medical examination with abnormal findings: Secondary | ICD-10-CM | POA: Diagnosis not present

## 2024-04-06 DIAGNOSIS — Z1389 Encounter for screening for other disorder: Secondary | ICD-10-CM | POA: Diagnosis not present

## 2024-04-06 DIAGNOSIS — Z1331 Encounter for screening for depression: Secondary | ICD-10-CM | POA: Diagnosis not present

## 2024-04-06 DIAGNOSIS — Z6821 Body mass index (BMI) 21.0-21.9, adult: Secondary | ICD-10-CM | POA: Diagnosis not present

## 2024-04-09 DIAGNOSIS — E1165 Type 2 diabetes mellitus with hyperglycemia: Secondary | ICD-10-CM | POA: Diagnosis not present

## 2024-04-09 DIAGNOSIS — Z8673 Personal history of transient ischemic attack (TIA), and cerebral infarction without residual deficits: Secondary | ICD-10-CM | POA: Diagnosis not present

## 2024-04-09 DIAGNOSIS — F419 Anxiety disorder, unspecified: Secondary | ICD-10-CM | POA: Diagnosis not present

## 2024-04-09 DIAGNOSIS — E785 Hyperlipidemia, unspecified: Secondary | ICD-10-CM | POA: Diagnosis not present

## 2024-04-09 DIAGNOSIS — I10 Essential (primary) hypertension: Secondary | ICD-10-CM | POA: Diagnosis not present

## 2024-04-09 DIAGNOSIS — Z1329 Encounter for screening for other suspected endocrine disorder: Secondary | ICD-10-CM | POA: Diagnosis not present

## 2024-04-09 DIAGNOSIS — Z6821 Body mass index (BMI) 21.0-21.9, adult: Secondary | ICD-10-CM | POA: Diagnosis not present

## 2024-04-09 DIAGNOSIS — R5383 Other fatigue: Secondary | ICD-10-CM | POA: Diagnosis not present
